# Patient Record
Sex: Male | Born: 1990 | Hispanic: Yes | Marital: Single | Smoking: Current every day smoker
Health system: Southern US, Community
[De-identification: ages and names within clinical notes are randomized; demographics above are authoritative.]

## PROBLEM LIST (undated history)

## (undated) DIAGNOSIS — J189 Pneumonia, unspecified organism: Secondary | ICD-10-CM

## (undated) HISTORY — PX: OTHER SURGICAL HISTORY: SHX169

## (undated) HISTORY — DX: Pneumonia, unspecified organism: J18.9

---
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
## 2015-04-12 ENCOUNTER — Emergency Department (HOSPITAL_BASED_OUTPATIENT_CLINIC_OR_DEPARTMENT_OTHER): Payer: Self-pay

## 2015-04-12 ENCOUNTER — Encounter (HOSPITAL_BASED_OUTPATIENT_CLINIC_OR_DEPARTMENT_OTHER): Payer: Self-pay | Admitting: *Deleted

## 2015-04-12 DIAGNOSIS — B349 Viral infection, unspecified: Secondary | ICD-10-CM | POA: Insufficient documentation

## 2015-04-12 DIAGNOSIS — H9209 Otalgia, unspecified ear: Secondary | ICD-10-CM | POA: Insufficient documentation

## 2015-04-12 DIAGNOSIS — Z720 Tobacco use: Secondary | ICD-10-CM | POA: Insufficient documentation

## 2015-04-12 NOTE — ED Notes (Signed)
Pt here with cough and cold and fever.  Pt states that he has pain with coughing in his chest.

## 2015-04-13 ENCOUNTER — Encounter (HOSPITAL_BASED_OUTPATIENT_CLINIC_OR_DEPARTMENT_OTHER): Payer: Self-pay | Admitting: Emergency Medicine

## 2015-04-13 ENCOUNTER — Emergency Department (HOSPITAL_BASED_OUTPATIENT_CLINIC_OR_DEPARTMENT_OTHER)
Admission: EM | Admit: 2015-04-13 | Discharge: 2015-04-13 | Disposition: A | Discharge status: Home / Self Care | Payer: Self-pay | Attending: Emergency Medicine | Admitting: Emergency Medicine

## 2015-04-13 DIAGNOSIS — B349 Viral infection, unspecified: Secondary | ICD-10-CM

## 2015-04-13 MED ORDER — FLUTICASONE PROPIONATE 50 MCG/ACT NA SUSP: 2.0000 | Freq: Every day | NASAL | Status: DC

## 2015-04-13 MED ORDER — GUAIFENESIN 200 MG PO TABS: 200.0000 mg | ORAL_TABLET | ORAL | Status: AC | PRN

## 2015-04-13 MED ORDER — LORATADINE 10 MG PO TABS
10.0000 mg | ORAL_TABLET | Freq: Once | ORAL | Status: AC
  Administered 2015-04-13: 10 mg via ORAL
  Filled 2015-04-13: qty 1

## 2015-04-13 MED ORDER — CETIRIZINE-PSEUDOEPHEDRINE ER 5-120 MG PO TB12: 1.0000 | ORAL_TABLET | Freq: Two times a day (BID) | ORAL | Status: DC

## 2015-04-13 MED ORDER — KETOROLAC TROMETHAMINE 60 MG/2ML IM SOLN
60.0000 mg | Freq: Once | INTRAMUSCULAR | Status: AC
  Administered 2015-04-13: 60 mg via INTRAMUSCULAR
  Filled 2015-04-13: qty 2

## 2015-04-13 NOTE — ED Provider Notes (Signed)
CSN: [id number]     Arrival date & time 04/12/15  2142 History   First MD Initiated Contact with Patient 04/13/15 0057     Chief Complaint  Patient presents with  . URI     (Consider location/radiation/quality/duration/timing/severity/associated sxs/prior Treatment) Patient is a 24 y.o. male presenting with URI. The history is provided by the patient.  URI Presenting symptoms: congestion, cough, ear pain and rhinorrhea   Presenting symptoms: no fever   Severity:  Moderate Onset quality:  Gradual Duration:  2 days Timing:  Constant Progression:  Unchanged Chronicity:  New Relieved by:  Nothing Worsened by:  Nothing tried Ineffective treatments:  None tried Associated symptoms: myalgias   Associated symptoms: no neck pain, no swollen glands and no wheezing   Risk factors: no sick contacts     History reviewed. No pertinent past medical history. History reviewed. No pertinent past surgical history. History reviewed. No pertinent family history. History  Substance Use Topics  . Smoking status: Current Every Day Smoker -- 0.50 packs/day  . Smokeless tobacco: Not on file  . Alcohol Use: No    Review of Systems  Constitutional: Negative for fever.  HENT: Positive for congestion, ear pain and rhinorrhea.   Eyes: Negative for photophobia.  Respiratory: Positive for cough. Negative for wheezing.   Musculoskeletal: Positive for myalgias. Negative for neck pain.  All other systems reviewed and are negative.     Allergies  Review of patient's allergies indicates no known allergies.  Home Medications   Prior to Admission medications   Medication Sig Start Date End Date Taking? Authorizing Provider  cetirizine-pseudoephedrine (ZYRTEC-D) 5-120 MG per tablet Take 1 tablet by mouth 2 (two) times daily. 04/13/15   Dalvin Clipper, MD  fluticasone [facility](FLONASE) 50 MCG/ACT nasal spray Place 2 sprays into both nostrils daily. 04/13/15   Rinnah Peppel, MD  guaiFENesin 200 MG tablet Take 1  tablet (200 mg total) by mouth every 4 (four) hours as needed for cough or to loosen phlegm. 04/13/15   Aloysuis Ribaudo, MD   BP 122/81 mmHg  Pulse 97  Temp(Src) 99.1 F (37.3 C) (Oral)  Resp 18  Ht 5\' 6"  (1.676 m)  Wt 180 lb (81.647 kg)  BMI 29.07 kg/m2  SpO2 96% Physical Exam  Constitutional: He is oriented to person, place, and time. He appears well-developed and well-nourished. No distress.  HENT:  Head: Normocephalic and atraumatic.  Mouth/Throat: Oropharynx is clear and moist. No oropharyngeal exudate.  Eyes: Conjunctivae are normal. Pupils are equal, round, and reactive to light.  Neck: Normal range of motion. Neck supple.  Cardiovascular: Normal rate, regular rhythm and intact distal pulses.   Pulmonary/Chest: Effort normal and breath sounds normal. No respiratory distress. He has no wheezes. He has no rales. He exhibits no tenderness.  Abdominal: Soft. Bowel sounds are normal. There is no tenderness. There is no rebound and no guarding.  Musculoskeletal: Normal range of motion.  Lymphadenopathy:    He has no cervical adenopathy.  Neurological: He is alert and oriented to person, place, and time.  Skin: Skin is warm and dry.  Psychiatric: He has a normal mood and affect.    ED Course  Procedures (including critical care time) Labs Review Labs Reviewed - No data to display  Imaging Review Dg Chest 2 View  04/12/2015   CLINICAL DATA:  Acute onset of cough and fever. Chest pain when coughing. Initial encounter.  EXAM: CHEST  2 VIEW  COMPARISON:  None.  FINDINGS: The lungs are well-aerated.  Peribronchial thickening is noted. There is no evidence of focal opacification, pleural effusion or pneumothorax.  The heart is normal in size; the mediastinal contour is within normal limits. No acute osseous abnormalities are seen.  IMPRESSION: Peribronchial thickening noted; lungs otherwise clear.   Electronically Signed   By: Roanna Raider M.D.   On: 04/12/2015 23:24     EKG  Interpretation None      MDM   Final diagnoses:  Viral syndrome    Will treat symptomatically.  Strict return precautions given.      Cy Blamer, MD 04/13/15 [phone]

## 2015-04-19 DIAGNOSIS — J189 Pneumonia, unspecified organism: Secondary | ICD-10-CM

## 2015-04-19 HISTORY — DX: Pneumonia, unspecified organism: J18.9

## 2015-04-22 ENCOUNTER — Emergency Department (HOSPITAL_BASED_OUTPATIENT_CLINIC_OR_DEPARTMENT_OTHER)
Admission: EM | Admit: 2015-04-22 | Discharge: 2015-04-22 | Disposition: A | Discharge status: Home / Self Care | Payer: Self-pay | Attending: Emergency Medicine | Admitting: Emergency Medicine

## 2015-04-22 ENCOUNTER — Emergency Department (HOSPITAL_BASED_OUTPATIENT_CLINIC_OR_DEPARTMENT_OTHER): Payer: Self-pay

## 2015-04-22 DIAGNOSIS — R51 Headache: Secondary | ICD-10-CM | POA: Insufficient documentation

## 2015-04-22 DIAGNOSIS — Z720 Tobacco use: Secondary | ICD-10-CM | POA: Insufficient documentation

## 2015-04-22 DIAGNOSIS — R059 Cough, unspecified: Secondary | ICD-10-CM

## 2015-04-22 DIAGNOSIS — R05 Cough: Secondary | ICD-10-CM | POA: Insufficient documentation

## 2015-04-22 DIAGNOSIS — M791 Myalgia: Secondary | ICD-10-CM | POA: Insufficient documentation

## 2015-04-22 DIAGNOSIS — M549 Dorsalgia, unspecified: Secondary | ICD-10-CM | POA: Insufficient documentation

## 2015-04-22 DIAGNOSIS — R509 Fever, unspecified: Secondary | ICD-10-CM | POA: Insufficient documentation

## 2015-04-22 DIAGNOSIS — Z79899 Other long term (current) drug therapy: Secondary | ICD-10-CM | POA: Insufficient documentation

## 2015-04-22 MED ORDER — IBUPROFEN 800 MG PO TABS
800.0000 mg | ORAL_TABLET | Freq: Once | ORAL | Status: AC
  Administered 2015-04-22: 800 mg via ORAL
  Filled 2015-04-22: qty 1

## 2015-04-22 MED ORDER — AZITHROMYCIN 250 MG PO TABS: 250.0000 mg | ORAL_TABLET | Freq: Every day | ORAL | Status: DC

## 2015-04-22 MED ORDER — ALBUTEROL SULFATE HFA 108 (90 BASE) MCG/ACT IN AERS
2.0000 | INHALATION_SPRAY | RESPIRATORY_TRACT | Status: DC | PRN
  Administered 2015-04-22: 2 via RESPIRATORY_TRACT
  Filled 2015-04-22: qty 6.7

## 2015-04-22 MED ORDER — ACETAMINOPHEN 500 MG PO TABS
ORAL_TABLET | ORAL | Status: DC
Start: 2015-04-22 — End: 2015-04-23
  Filled 2015-04-22: qty 2

## 2015-04-22 MED ORDER — ACETAMINOPHEN 500 MG PO TABS
1000.0000 mg | ORAL_TABLET | Freq: Once | ORAL | Status: AC
  Administered 2015-04-22: 1000 mg via ORAL

## 2015-04-22 MED ORDER — ACETAMINOPHEN 325 MG PO TABS: 650.0000 mg | ORAL_TABLET | Freq: Once | ORAL | Status: DC

## 2015-04-22 NOTE — ED Provider Notes (Signed)
CSN: [id number]     Arrival date & time 04/22/15  1745 History  This chart was scribed for Jerelyn ScottMartha Linker, MD by Bronson CurbJacqueline Melvin, ED Scribe. This patient was seen in room MH10/MH10 and the patient's care was started at 7:11 PM.   Chief Complaint  Patient presents with  . URI    Patient is a 24 y.o. male presenting with URI. The history is provided by the patient. No language interpreter was used.  URI Presenting symptoms: cough and fever   Presenting symptoms: no congestion and no sore throat   Severity:  Moderate Duration:  2 weeks Timing:  Constant Progression:  Unchanged Chronicity:  New Relieved by:  Nothing Ineffective treatments:  Prescription medications Associated symptoms: headaches and myalgias      HPI Comments: Jon Hill is a 24 y.o. male who presents to the Emergency Department complaining of possible URI for the past 1-2 weeks. There is associated fever (triage temp 102.4 F), headache, and persistent intermittent cough. Patient was seen here 1 week ago and was given nasal spray and another unknown medication with only mild relief. He reports his symptoms still persist and states he woke up today with "pain in the lungs" which prompted him to return to the ED. He also reports pain in his lungs that is worse on the right and also mentions associated moderate back pain that is worse with movement and deep breathing. Patient notes sick contacts with his father and sister, however, he reports their symptoms resolved after taking Tylenol. He denies sore throat, congestion, sinus pressure, dysuria, tick bites, or recent travel.   No past medical history on file. No past surgical history on file. No family history on file. History  Substance Use Topics  . Smoking status: Current Every Day Smoker -- 0.50 packs/day  . Smokeless tobacco: Not on file  . Alcohol Use: No    Review of Systems  Constitutional: Positive for fever.  HENT: Negative for congestion, sinus pressure and  sore throat.   Respiratory: Positive for cough.   Genitourinary: Negative for dysuria.  Musculoskeletal: Positive for myalgias and back pain.  Neurological: Positive for headaches.  All other systems reviewed and are negative.     Allergies  Review of patient's allergies indicates no known allergies.  Home Medications   Prior to Admission medications   Medication Sig Start Date End Date Taking? Authorizing Provider  azithromycin (ZITHROMAX) 250 MG tablet Take 1 tablet (250 mg total) by mouth daily. Take first 2 tablets together, then 1 every day until finished. 04/22/15   Jerelyn ScottMartha Linker, MD  cetirizine-pseudoephedrine (ZYRTEC-D) 5-120 MG per tablet Take 1 tablet by mouth 2 (two) times daily. 04/13/15   April Palumbo, MD  fluticasone [facility](FLONASE) 50 MCG/ACT nasal spray Place 2 sprays into both nostrils daily. 04/13/15   April Palumbo, MD  guaiFENesin 200 MG tablet Take 1 tablet (200 mg total) by mouth every 4 (four) hours as needed for cough or to loosen phlegm. 04/13/15   April Palumbo, MD   Triage Vitals: BP 123/76 mmHg  Pulse 116  Temp(Src) 102.4 F (39.1 C) (Oral)  Resp 18  Wt 180 lb (81.647 kg)  SpO2 100% Vitals reviewed Physical Exam  Physical Examination: General appearance - alert, well appearing, and in no distress Mental status - alert, oriented to person, place, and time Eyes - no conjunctival injection, no scleral icterus Mouth - mucous membranes moist, pharynx normal without lesions Chest - clear to auscultation, no wheezes, rales or rhonchi, symmetric air  entry, normal respiratory effort Heart - normal rate, regular rhythm, normal S1, S2, no murmurs, rubs, clicks or gallops Abdomen - soft, nontender, nondistended, no masses or organomegaly Extremities - peripheral pulses normal, no pedal edema, no clubbing or cyanosis Skin - normal coloration and turgor, no rashes  ED Course  Procedures (including critical care time)  DIAGNOSTIC STUDIES: Oxygen Saturation is 100% on  room air, normal by my interpretation.    COORDINATION OF CARE: At 1916 Discussed treatment plan with patient which includes albuterol inhaler and ibuprofen. Patient agrees.   Labs Review Labs Reviewed - No data to display  Imaging Review Dg Chest 2 View  04/22/2015   CLINICAL DATA:  Cough and fever for 2 weeks.  EXAM: CHEST  2 VIEW  COMPARISON:  04/12/2015  FINDINGS: The heart size and mediastinal contours are within normal limits. Chronic central peribronchial thickening again noted. No evidence of pulmonary infiltrate or pleural effusion. No evidence of pulmonary hyperinflation. The visualized skeletal structures are unremarkable.  IMPRESSION: Chronic central peribronchial thickening.  No active lung disease.   Electronically Signed   By: Myles RosenthalJohn  Stahl M.D.   On: 04/22/2015 19:02     EKG Interpretation None      MDM   Final diagnoses:  Febrile illness  Cough    Pt presenting with c/o fever/chills, cough and chest pain with coughing and deep breathing.  Pt states symptosm have been ongoing for the past 2 weeks.  He states the symptomatic treatment has not been helping with symptoms.  CXR was reassuring tonight.   Xray images reviewed and interpreted by me as well.  Pt was given albuterol inhaler to help with coughing.  Also given zithromax for possible clinical pneumonia even though it is not seen on the xray.  Pt's vitals improving after fever down, he is drinking liquids well.  Discharged with strict return precautions.  Pt agreeable with plan.  I personally performed the services described in this documentation, which was scribed in my presence. The recorded information has been reviewed and is accurate.    Jerelyn ScottMartha Linker, MD 04/24/15 [phone]

## 2015-04-22 NOTE — Discharge Instructions (Signed)
Return to the ED with any concerns including difficulty breathing, vomiting and not able to keep down liquids, fainting, decreased level of alertness/lethargy, or any other alarming symptoms  You should use the albuterol inhaler 2 puffs every 4 hours for cough

## 2015-04-22 NOTE — ED Notes (Signed)
Pt c/o URI symptoms  x 2 weeks Seen here last week for same

## 2015-04-23 ENCOUNTER — Emergency Department (HOSPITAL_COMMUNITY)
Admission: EM | Admit: 2015-04-23 | Discharge: 2015-04-23 | Disposition: A | Discharge status: Home / Self Care | Payer: Self-pay | Attending: Emergency Medicine | Admitting: Emergency Medicine

## 2015-04-23 ENCOUNTER — Encounter (HOSPITAL_COMMUNITY): Payer: Self-pay | Admitting: Emergency Medicine

## 2015-04-23 DIAGNOSIS — Z7951 Long term (current) use of inhaled steroids: Secondary | ICD-10-CM | POA: Insufficient documentation

## 2015-04-23 DIAGNOSIS — Z79899 Other long term (current) drug therapy: Secondary | ICD-10-CM | POA: Insufficient documentation

## 2015-04-23 DIAGNOSIS — Z720 Tobacco use: Secondary | ICD-10-CM | POA: Insufficient documentation

## 2015-04-23 DIAGNOSIS — R000 Tachycardia, unspecified: Secondary | ICD-10-CM | POA: Insufficient documentation

## 2015-04-23 DIAGNOSIS — J40 Bronchitis, not specified as acute or chronic: Secondary | ICD-10-CM | POA: Insufficient documentation

## 2015-04-23 DIAGNOSIS — Z792 Long term (current) use of antibiotics: Secondary | ICD-10-CM | POA: Insufficient documentation

## 2015-04-23 MED ORDER — HYDROCODONE-ACETAMINOPHEN 5-325 MG PO TABS
2.0000 | ORAL_TABLET | Freq: Once | ORAL | Status: AC
  Administered 2015-04-23: 2 via ORAL
  Filled 2015-04-23: qty 2

## 2015-04-23 MED ORDER — BENZONATATE 100 MG PO CAPS: 200.0000 mg | ORAL_CAPSULE | Freq: Two times a day (BID) | ORAL | Status: DC | PRN

## 2015-04-23 MED ORDER — ALBUTEROL SULFATE (2.5 MG/3ML) 0.083% IN NEBU
5.0000 mg | INHALATION_SOLUTION | Freq: Once | RESPIRATORY_TRACT | Status: AC
  Administered 2015-04-23: 5 mg via RESPIRATORY_TRACT
  Filled 2015-04-23: qty 6

## 2015-04-23 MED ORDER — IBUPROFEN 800 MG PO TABS
800.0000 mg | ORAL_TABLET | Freq: Once | ORAL | Status: AC
  Administered 2015-04-23: 800 mg via ORAL
  Filled 2015-04-23: qty 1

## 2015-04-23 MED ORDER — NAPROXEN 500 MG PO TABS: 500.0000 mg | ORAL_TABLET | Freq: Two times a day (BID) | ORAL | Status: AC

## 2015-04-23 MED ORDER — IPRATROPIUM BROMIDE 0.02 % IN SOLN
0.5000 mg | RESPIRATORY_TRACT | Status: AC
  Administered 2015-04-23: 0.5 mg via RESPIRATORY_TRACT
  Filled 2015-04-23: qty 2.5

## 2015-04-23 MED ORDER — SODIUM CHLORIDE 0.9 % IV BOLUS (SEPSIS): 1000.0000 mL | INTRAVENOUS | Status: DC

## 2015-04-23 MED ORDER — ACETAMINOPHEN 325 MG PO TABS: 650.0000 mg | ORAL_TABLET | Freq: Once | ORAL | Status: DC

## 2015-04-23 MED ORDER — HYDROCODONE-ACETAMINOPHEN 5-325 MG PO TABS: 1.0000 | ORAL_TABLET | ORAL | Status: DC | PRN

## 2015-04-23 NOTE — ED Notes (Addendum)
Pt was seen at [facility] last [facility]. Given prescription for Azithromycin but has not had the chance to fill yet. Was given an inhaler on 4/25 and says he has been using this but now "it hurts to breathe." Also given Zyrtec and Guaifenesin on 4/25. SpO2 currently 98%. RR even/unlabored. No audible wheezing. Points to left mid back and right upper chest as painful during inspiration. No other c/c.

## 2015-04-23 NOTE — ED Notes (Addendum)
Per EMS- reports cold symptoms x2 weeks. Prescribed antibiotics recently and finished those. Had CXR completed last visit with no abnormal findings. Has had increase in mucous output and reports hemoptysis. VS: BP 116/74 HR 114 SpO2 96% Temp 98.4 tympanic.   Addendum-patient has not started antibiotic therapy as of today.

## 2015-04-23 NOTE — Discharge Instructions (Signed)
Please follow the directions provided.  Use the referral or the resource guide to establish care with a doctor to ensure you are getting better.  Be sure to fill the prescription for the antibiotic and take as directed.  Use your albuterol inhaler 2 puffs every 4 hours for cough or wheezing.  Use the tessalon perles as directed for coughing.  Take the Naproxen twice a day for pain.  You may use the vicodin for pain not relieved by the naproxen.  Don't hesitate to return for any new, worsening  or concerning symptoms.      SEEK IMMEDIATE MEDICAL CARE IF:  You have a fever.  You develop severe or persistent headache, ear pain, sinus pain, or chest pain.  You develop wheezing, a prolonged cough, cough up blood, or have a change in your usual mucus (if you have chronic lung disease).  You develop sore muscles or a stiff neck.   Emergency Department Resource Guide 1) Find a Doctor and Pay Out of Pocket Although you won't have to find out who is covered by your insurance plan, it is a good idea to ask around and get recommendations. You will then need to call the office and see if the doctor you have chosen will accept you as a new patient and what types of options they offer for patients who are self-pay. Some doctors offer discounts or will set up payment plans for their patients who do not have insurance, but you will need to ask so you aren't surprised when you get to your appointment.  2) Contact Your Local Health Department Not all health departments have doctors that can see patients for sick visits, but many do, so it is worth a call to see if yours does. If you don't know where your local health department is, you can check in your phone book. The CDC also has a tool to help you locate your state's health department, and many state websites also have listings of all of their local health departments.  3) Find a Walk-in Clinic If your illness is not likely to be very severe or complicated, you  may want to try a walk in clinic. These are popping up all over the country in pharmacies, drugstores, and shopping centers. They're usually staffed by nurse practitioners or physician assistants that have been trained to treat common illnesses and complaints. They're usually fairly quick and inexpensive. However, if you have serious medical issues or chronic medical problems, these are probably not your best option.  No Primary Care Doctor: - Call Health Connect at  [phone] - they can help you locate a primary care doctor that  accepts your insurance, provides certain services, etc. - Physician Referral Service- [phone]  Chronic Pain Problems: Organization         Address  Phone   Notes  [facility]  [phone] Patients need to be referred by their primary care doctor.   Medication Assistance: Organization         Address  Phone   Notes  [facility] Medication [facility] [address] [place] [place], [place] [address] [phone] --Must be a resident of [facility] -- Must have NO insurance coverage whatsoever (no Medicaid/ Medicare, etc.) -- The pt. MUST have a primary care doctor that directs their care regularly and follows them in the community   [facility]  [phone]   [employer]  [phone]    Agencies that provide inexpensive  medical care: Organization         Address  Phone   Notes  [facility]  [phone]   [facility]    [phone]   [facility] [address] [place], [place] [address] [phone]   [facility] [address] [place], [place] [phone]   Planned Parenthood    [phone]   [facility]    [phone]   [facility]  [address], [place] Phone:  [phone], Fax:  [phone] Hours of Operation:  9 am - 6 pm, M-F.  Also accepts Medicaid/Medicare and  self-pay.  [facility]  [address], [place] Phone: [phone], Fax: [phone]. Hours of Operation:  8:30 am - 5:30 pm, M-F.  Also accepts Medicaid and self-pay.  [facility] [place] [address], [place] Phone: [phone]   [facility] [address] Natasha Bence [place], [place] [phone], Ext. [phone] Mondays & Thursdays: 7-9 AM.  First 15 patients are seen on a first come, first serve basis.    Medicaid-accepting [facility] Providers:  Organization         Address  Phone   Notes  [facility] [address], [place] [phone] Also accepts self-pay patients.  [facility] [address] Laurell Josephs [place], [place]  [phone]   [facility] [address], [place] [phone]   [facility] [address], [place] [phone]   Renaye Rakers [address], [place]   [phone] Only accepts [place] patients after they have their name applied to their card.   Self-Pay (no insurance) in [facility]:  Organization         Address  Phone   Notes  Sickle Cell Patients, [facility] [address] [place], [place] [phone]   [facility] [address] [place], [place] [phone]   [facility]  [address], [place] [phone]   [facility]/Dr. Osei-Bonsu  [address], [place] or [address], [place] [phone] Phone number for both [place] and [place] locations is the same.  [facility] [address] [place] [phone]   [facility] [address] [place] or [address] [phone]   [facility] [address], [place] [phone], phone;  [phone], fax Sees patients 1st and 3rd Saturday of every month.  Must not qualify for public or private insurance (i.e. Medicaid, Medicare, [place] Health Choice, Veterans' Benefits)  Household income should be no more than 200% of the poverty level The clinic cannot treat you if you are pregnant or think you are pregnant  Sexually transmitted diseases are not treated at the clinic.    Dental Care: Organization         Address  Phone  Notes  [facility] Department of [facility] [address] [place], [place] [phone] Accepts children up to age 11 who are enrolled in [place] or [place] Health Choice; pregnant women with a Medicaid card; and children who have applied for Medicaid or [place] Health Choice, but  were declined, whose parents can pay a reduced fee at time of service.  [facility] [place]  [address], [place] [phone]) [phone] Accepts children up to age 23 who are enrolled in [place] or [place] Health Choice; pregnant women with a Medicaid card; and children who have applied for Medicaid or [place] Health Choice, but were declined, whose parents can pay a reduced fee at time of service.  [facility]  [address], [place] [phone] Patients are seen by appointment only. Walk-ins are not accepted. [facility] will see patients 23 years of age and older. Monday - Tuesday (8am-5pm) Most Wednesdays (8:30-5pm) $30 per visit, cash only  [facility]  [address], [facility] [phone] Patients are seen by appointment only. Walk-ins are not accepted. [facility] will see patients 23 years of age and older. One Wednesday Evening (Monthly: Volunteer Based).  $30 per visit, cash only  [employer] [facility] [facility]  [phone]) [phone] for adults; Children under age 694, call Graduate Pediatric Dentistry at [phone]. Children aged 844-14, please call  [phone]) [phone] to request a pediatric application.  Dental services are provided in all areas of dental care including fillings, crowns and bridges, complete and partial dentures, implants, gum treatment, root canals, and extractions. Preventive care is also provided. Treatment is provided to both adults and children. Patients are selected via a lottery and there is often a waiting list.   [facility] [address], [place]  [phone]) [phone] [email or web address]   [facility] [address], [place], [place] [phone], Ext. [phone] Second and Fourth Thursday of each month, opens at 6:30 AM; Clinic ends at 9 AM.  Patients are seen on a first-come first-served basis, and a limited number are seen during each clinic.   [facility]  [address], [place], [place] [phone]   Eligibility Requirements You must have lived in [place], [place], or [place] counties for at least the last three months.   You cannot be eligible for state or federal sponsored National Cityhealthcare insurance, including CIGNAVeterans Administration, [place]icaid, or [employer]icare.   You generally cannot be eligible for healthcare insurance through your employer.    How to apply: Eligibility screenings are held every Tuesday and Wednesday afternoon from 1:00 pm until 4:00 pm. You do not need an appointment for the interview!  [facility] [address], [place], [place] [phone]   [facility]  [phone]   [facility]  [phone]   [facility]  [phone]    Behavioral Health Resources in the Community: Intensive Outpatient Programs Organization         Address  Phone  Notes  [facility] [address], [place], [place] [phone]   [facility] [address], [place], [place] [phone]   [facility] [address], [place], [place]   [phone]   [facility] [address],  [place], [place] [phone] or [phone]   Substance Abuse Resources Organization         Address  Phone  Notes  [facility]  [phone]   [facility]  [phone]   [facility]  [phone]   [facility]  [phone]   Residential & Outpatient Substance Abuse Program  [phone]   Psychological Services  Organization         Address  Phone  Notes  [facility]  [phone]- [phone]   [facility]  [phone]- [phone]   [facility] [address], [place] [phone] or [phone]    Mobile Crisis Teams Organization         Address  Phone  Notes  [facility], Mobile Crisis Care Unit  [phone]   [facility]  [address] [place], [place] [phone]   Doristine LocksSharon DeEsch [address] [place] [phone]    Self-Help/Support Groups Organization         Address  Phone             Notes  [facility] [place] - variety of support groups  336- [phone] Call for more information  Narcotics Anonymous (NA), [facility] [address], [place]  2 meetings at this location   Statisticianesidential Treatment Programs Organization         Address  Phone  Notes  [facility] [address],    [place]  [phone]   [facility]  [address], [place] [phone], [place], [place] [phone]   [facility] [address], [place] [phone] Admissions: 8am-3pm M-F  [facility] [address]    [place], [place] [phone]   [facility] [address], [place], [place] [phone]   [facility] [address]  [place], [place] [phone]   [facility] - Intensive Outpatient [address], [place], [place] [phone]   [facility] [address]  [place], [place] [phone] or [phone]   [facility] ([facility]) [address] [place], [place] [phone] Accepts Medicaid  [facility] [address]  [place] [phone] Substance Abuse/Addiction Treatment   [facility] Resources Organization         Address  Phone  Notes  [facility]  [phone]   Angie FavaJulie Brannon, PhD [address], [address] [place], [place]   [phone](336) [phone] or [phone]) [phone]   [facility]   [address] [place], [place] [phone]) [phone]   [facility] [address], [place], [place] [phone] Insurance/Medicaid/sponsorship through [facility]  [facility] [address]                                    [place], [place] [phone]) [phone] Therapy/tele-psych/case  [facility] [address]   [place], [place] [phone]) [phone]    Dr. Lolly MustacheArfeen  [phone]) [phone]   [facility]  [employer] [facility] 1) [address],  2) [address], [place] 3)  [address], [place] [phone]  [phone]([phone]   [facility] Child Abuse Hotline [phone](336) [phone] or [phone](336) [phone] (After Hours)

## 2015-04-23 NOTE — ED Notes (Signed)
Bed: ZO10WA11 Expected date:  Expected time:  Means of arrival:  Comments: EMS 24 yo male with URI symptoms and "coughing up blood"

## 2015-04-23 NOTE — ED Notes (Signed)
PA at bedside.

## 2015-04-23 NOTE — ED Provider Notes (Signed)
CSN: [id number]     Arrival date & time 04/23/15  0458 History   First MD Initiated Contact with Patient 04/23/15 0515     Chief Complaint  Patient presents with  . Hemoptysis  . URI   (Consider location/radiation/quality/duration/timing/severity/associated sxs/prior Treatment) HPI  Jon Hill is a 24 yo male presenting with continued coughing and pain with inspiration.  He was seen last night for the same symptoms but returned because of continued pain in his chest with coughing and deep breaths.  He reports recent fevers and chills also.  He states he has had some sputum production with coughing and describes it as clear with some bloody streaks. He rates his pain currently as 10/10. He denies any headaches, nausea, vomiting, leg swelling, recent travel or trauma.  History reviewed. No pertinent past medical history. History reviewed. No pertinent past surgical history. History reviewed. No pertinent family history. History  Substance Use Topics  . Smoking status: Current Every Day Smoker -- 0.50 packs/day  . Smokeless tobacco: Not on file  . Alcohol Use: No    Review of Systems  Constitutional: Positive for fever and chills.  HENT: Negative for sore throat.   Eyes: Negative for visual disturbance.  Respiratory: Positive for cough, chest tightness and shortness of breath.   Cardiovascular: Negative for chest pain and leg swelling.  Gastrointestinal: Negative for nausea, vomiting and diarrhea.  Genitourinary: Negative for dysuria.  Musculoskeletal: Positive for myalgias.  Skin: Negative for rash.  Neurological: Negative for weakness, numbness and headaches.      Allergies  Review of patient's allergies indicates no known allergies.  Home Medications   Prior to Admission medications   Medication Sig Start Date End Date Taking? Authorizing Provider  azithromycin (ZITHROMAX) 250 MG tablet Take 1 tablet (250 mg total) by mouth daily. Take first 2 tablets together, then 1 every  day until finished. 04/22/15   Jerelyn ScottMartha Linker, MD  cetirizine-pseudoephedrine (ZYRTEC-D) 5-120 MG per tablet Take 1 tablet by mouth 2 (two) times daily. 04/13/15   April Palumbo, MD  fluticasone [facility](FLONASE) 50 MCG/ACT nasal spray Place 2 sprays into both nostrils daily. 04/13/15   April Palumbo, MD  guaiFENesin 200 MG tablet Take 1 tablet (200 mg total) by mouth every 4 (four) hours as needed for cough or to loosen phlegm. 04/13/15   April Palumbo, MD   BP 127/69 mmHg  Pulse 113  Temp(Src) 101.8 F (38.8 C) (Oral)  SpO2 98% Physical Exam  Constitutional: He appears well-developed and well-nourished. No distress.  HENT:  Head: Normocephalic and atraumatic.  Mouth/Throat: Oropharynx is clear and moist. No oropharyngeal exudate.  Eyes: Conjunctivae are normal.  Neck: Neck supple.  Cardiovascular: Regular rhythm and intact distal pulses.  Tachycardia present.   Pulmonary/Chest: Effort normal and breath sounds normal. No respiratory distress. He has no wheezes. He has no rales. He exhibits no tenderness.  Abdominal: Soft. There is no tenderness.  Musculoskeletal: He exhibits no tenderness.  Lymphadenopathy:    He has no cervical adenopathy.  Neurological: He is alert.  Skin: Skin is warm and dry. No rash noted. He is not diaphoretic.  Skin feels hot  Psychiatric: He has a normal mood and affect.  Nursing note and vitals reviewed.   ED Course  Procedures (including critical care time) Labs Review Labs Reviewed - No data to display  Imaging Review Dg Chest 2 View  04/22/2015   CLINICAL DATA:  Cough and fever for 2 weeks.  EXAM: CHEST  2 VIEW  COMPARISON:  04/12/2015  FINDINGS: The heart size and mediastinal contours are within normal limits. Chronic central peribronchial thickening again noted. No evidence of pulmonary infiltrate or pleural effusion. No evidence of pulmonary hyperinflation. The visualized skeletal structures are unremarkable.  IMPRESSION: Chronic central peribronchial  thickening.  No active lung disease.   Electronically Signed   By: Myles RosenthalJohn  Hill M.D.   On: 04/22/2015 19:02     EKG Interpretation None      MDM   Final diagnoses:  None   24 yo with continued cough, fever and generalized muscle aches. He was evaluated for same last night but symptoms still bothersome. His CXR at last eval was negative for pneumonia. Pt is febrile on exam, lungs are clear. Treated with neb treatment, ibuprofen and vicodin for symptom management.   6:00 AM: At end of shift, hand-off report given to [facility] Cartner, PA-C. Plan includes re-eval after treatment and most likely may be discharged home after symptoms improve.   Filed Vitals:   04/23/15 0502 04/23/15 0528 04/23/15 0621 04/23/15 0636  BP: 127/69  114/60   Pulse: 113  104   Temp: 98.7 F (37.1 C) 101.8 F (38.8 C)  98.5 F (36.9 C)  TempSrc: Oral Oral  Oral  Resp:   17   SpO2: 98%  100%    Meds given in ED:  Medications  albuterol (PROVENTIL) (2.5 MG/3ML) 0.083% nebulizer solution 5 mg (5 mg Nebulization Given 04/23/15 0553)  ipratropium (ATROVENT) nebulizer solution 0.5 mg (0.5 mg Nebulization Given 04/23/15 0553)  ibuprofen (ADVIL,MOTRIN) tablet 800 mg (800 mg Oral Given 04/23/15 0553)  HYDROcodone-acetaminophen (NORCO/VICODIN) 5-325 MG per tablet 2 tablet (2 tablets Oral Given 04/23/15 0553)    New Prescriptions   No medications on file       Harle BattiestElizabeth Anysha Frappier, NP 04/23/15 1710  Marisa Severinlga Otter, MD 04/24/15 [phone]

## 2015-04-25 ENCOUNTER — Emergency Department (HOSPITAL_COMMUNITY): Payer: Self-pay

## 2015-04-25 ENCOUNTER — Inpatient Hospital Stay (HOSPITAL_COMMUNITY)
Admission: EM | Admit: 2015-04-25 | Discharge: 2015-04-29 | DRG: 871 | Disposition: A | Discharge status: Home / Self Care | Payer: Self-pay | Attending: Internal Medicine | Admitting: Internal Medicine

## 2015-04-25 ENCOUNTER — Encounter (HOSPITAL_COMMUNITY): Payer: Self-pay | Admitting: Emergency Medicine

## 2015-04-25 DIAGNOSIS — E872 Acidosis: Secondary | ICD-10-CM | POA: Diagnosis present

## 2015-04-25 DIAGNOSIS — D6489 Other specified anemias: Secondary | ICD-10-CM | POA: Diagnosis present

## 2015-04-25 DIAGNOSIS — F17200 Nicotine dependence, unspecified, uncomplicated: Secondary | ICD-10-CM

## 2015-04-25 DIAGNOSIS — E876 Hypokalemia: Secondary | ICD-10-CM | POA: Diagnosis not present

## 2015-04-25 DIAGNOSIS — A419 Sepsis, unspecified organism: Principal | ICD-10-CM

## 2015-04-25 DIAGNOSIS — J9601 Acute respiratory failure with hypoxia: Secondary | ICD-10-CM | POA: Diagnosis present

## 2015-04-25 DIAGNOSIS — D649 Anemia, unspecified: Secondary | ICD-10-CM | POA: Diagnosis present

## 2015-04-25 DIAGNOSIS — R7881 Bacteremia: Secondary | ICD-10-CM | POA: Diagnosis present

## 2015-04-25 DIAGNOSIS — J441 Chronic obstructive pulmonary disease with (acute) exacerbation: Secondary | ICD-10-CM | POA: Diagnosis present

## 2015-04-25 DIAGNOSIS — R0602 Shortness of breath: Secondary | ICD-10-CM

## 2015-04-25 DIAGNOSIS — F17210 Nicotine dependence, cigarettes, uncomplicated: Secondary | ICD-10-CM | POA: Diagnosis present

## 2015-04-25 DIAGNOSIS — J13 Pneumonia due to Streptococcus pneumoniae: Secondary | ICD-10-CM | POA: Diagnosis present

## 2015-04-25 DIAGNOSIS — J189 Pneumonia, unspecified organism: Secondary | ICD-10-CM

## 2015-04-25 LAB — I-STAT CG4 LACTIC ACID, ED
Lactic Acid, Venous: 4.4 mmol/L (ref 0.5–2.0)
Lactic Acid, Venous: 2.85 mmol/L (ref 0.5–2.0)

## 2015-04-25 LAB — COMPREHENSIVE METABOLIC PANEL
ALBUMIN: 2.8 g/dL — AB (ref 3.5–5.0)
ALT: 25 U/L (ref 17–63)
AST: 26 U/L (ref 15–41)
Alkaline Phosphatase: 112 U/L (ref 38–126)
Anion gap: 9 (ref 5–15)
BUN: 12 mg/dL (ref 6–20)
CHLORIDE: 106 mmol/L (ref 101–111)
CO2: 24 mmol/L (ref 22–32)
CREATININE: 0.88 mg/dL (ref 0.61–1.24)
Calcium: 8.3 mg/dL — ABNORMAL LOW (ref 8.9–10.3)
GFR calc Af Amer: >60 mL/min (ref >60)
GFR calc non Af Amer: >60 mL/min (ref >60)
Glucose, Bld: 94 mg/dL (ref 70–99)
Potassium: 4 mmol/L (ref 3.5–5.1)
SODIUM: 139 mmol/L (ref 135–145)
TOTAL PROTEIN: 6.4 g/dL — AB (ref 6.5–8.1)
Total Bilirubin: 0.7 mg/dL (ref 0.3–1.2)

## 2015-04-25 LAB — URINE MICROSCOPIC-ADD ON

## 2015-04-25 LAB — RAPID HIV SCREEN (HIV 1/2 AB+AG)
HIV 1/2 Antibodies: NONREACTIVE
HIV-1 P24 Antigen - HIV24: NONREACTIVE

## 2015-04-25 LAB — CBC WITH DIFFERENTIAL/PLATELET
Basophils Absolute: 0 10*3/uL (ref 0.0–0.1)
Basophils Relative: 0 % (ref 0–1)
Eosinophils Absolute: 0 10*3/uL (ref 0.0–0.7)
Eosinophils Relative: 0 % (ref 0–5)
HEMATOCRIT: 42.1 % (ref 39.0–52.0)
HEMOGLOBIN: 14.7 g/dL (ref 13.0–17.0)
LYMPHS ABS: 0.8 10*3/uL (ref 0.7–4.0)
Lymphocytes Relative: 4 % — ABNORMAL LOW (ref 12–46)
MCH: 29.7 pg (ref 26.0–34.0)
MCHC: 34.9 g/dL (ref 30.0–36.0)
MCV: 85.1 fL (ref 78.0–100.0)
Monocytes Absolute: 0.4 10*3/uL (ref 0.1–1.0)
Monocytes Relative: 2 % — ABNORMAL LOW (ref 3–12)
NEUTROS ABS: 19.7 10*3/uL — AB (ref 1.7–7.7)
Neutrophils Relative %: 94 % — ABNORMAL HIGH (ref 43–77)
Platelets: 253 10*3/uL (ref 150–400)
RBC: 4.95 MIL/uL (ref 4.22–5.81)
RDW: 13.2 % (ref 11.5–15.5)
WBC Morphology: INCREASED
WBC: 20.9 10*3/uL — ABNORMAL HIGH (ref 4.0–10.5)

## 2015-04-25 LAB — URINALYSIS, ROUTINE W REFLEX MICROSCOPIC
Glucose, UA: NEGATIVE mg/dL
Hgb urine dipstick: NEGATIVE
KETONES UR: NEGATIVE mg/dL
NITRITE: NEGATIVE
Protein, ur: 30 mg/dL — AB
Specific Gravity, Urine: 1.023 (ref 1.005–1.030)
UROBILINOGEN UA: 4 mg/dL — AB (ref 0.0–1.0)
pH: 6 (ref 5.0–8.0)

## 2015-04-25 LAB — INFLUENZA PANEL BY PCR (TYPE A & B)
H1N1FLUPCR: NOT DETECTED
Influenza A By PCR: NEGATIVE
Influenza B By PCR: NEGATIVE

## 2015-04-25 LAB — PROTIME-INR
INR: 1.21 (ref 0.00–1.49)
PROTHROMBIN TIME: 15.4 s — AB (ref 11.6–15.2)

## 2015-04-25 LAB — EXPECTORATED SPUTUM ASSESSMENT W REFEX TO RESP CULTURE

## 2015-04-25 LAB — APTT: APTT: 43 s — AB (ref 24–37)

## 2015-04-25 LAB — LACTIC ACID, PLASMA: LACTIC ACID, VENOUS: 2.6 mmol/L — AB (ref 0.5–2.0)

## 2015-04-25 LAB — D-DIMER, QUANTITATIVE: D-Dimer, Quant: 1.05 ug/mL-FEU — ABNORMAL HIGH (ref 0.00–0.48)

## 2015-04-25 LAB — EXPECTORATED SPUTUM ASSESSMENT W GRAM STAIN, RFLX TO RESP C

## 2015-04-25 LAB — PROCALCITONIN: Procalcitonin: 2.07 ng/mL

## 2015-04-25 MED ORDER — ENOXAPARIN SODIUM 40 MG/0.4ML ~~LOC~~ SOLN
40.0000 mg | SUBCUTANEOUS | Status: DC
  Administered 2015-04-25 – 2015-04-28 (×4): 40 mg via SUBCUTANEOUS
  Filled 2015-04-25 (×6): qty 0.4

## 2015-04-25 MED ORDER — SODIUM CHLORIDE 0.9 % IV SOLN
INTRAVENOUS | Status: DC
  Administered 2015-04-25 – 2015-04-26 (×2): via INTRAVENOUS

## 2015-04-25 MED ORDER — SODIUM CHLORIDE 0.9 % IJ SOLN
3.0000 mL | Freq: Two times a day (BID) | INTRAMUSCULAR | Status: DC
  Administered 2015-04-26 – 2015-04-28 (×5): 3 mL via INTRAVENOUS

## 2015-04-25 MED ORDER — SODIUM CHLORIDE 0.9 % IV BOLUS (SEPSIS)
1000.0000 mL | Freq: Once | INTRAVENOUS | Status: AC
  Administered 2015-04-25: 1000 mL via INTRAVENOUS

## 2015-04-25 MED ORDER — ONDANSETRON HCL 4 MG/2ML IJ SOLN
4.0000 mg | Freq: Four times a day (QID) | INTRAMUSCULAR | Status: DC | PRN
  Administered 2015-04-25: 4 mg via INTRAVENOUS
  Filled 2015-04-25: qty 2

## 2015-04-25 MED ORDER — ACETAMINOPHEN 325 MG PO TABS
650.0000 mg | ORAL_TABLET | Freq: Four times a day (QID) | ORAL | Status: DC | PRN
  Administered 2015-04-28: 650 mg via ORAL
  Filled 2015-04-25: qty 2

## 2015-04-25 MED ORDER — CEFTRIAXONE SODIUM 1 G IJ SOLR
1.0000 g | INTRAMUSCULAR | Status: DC
  Administered 2015-04-26 – 2015-04-29 (×4): 1 g via INTRAVENOUS
  Filled 2015-04-25 (×5): qty 10

## 2015-04-25 MED ORDER — AZITHROMYCIN 250 MG PO TABS
500.0000 mg | ORAL_TABLET | Freq: Once | ORAL | Status: AC
  Administered 2015-04-25: 500 mg via ORAL
  Filled 2015-04-25: qty 2

## 2015-04-25 MED ORDER — MORPHINE SULFATE 2 MG/ML IJ SOLN
1.0000 mg | INTRAMUSCULAR | Status: DC | PRN
Start: 2015-04-25 — End: 2015-04-27

## 2015-04-25 MED ORDER — ONDANSETRON HCL 4 MG PO TABS: 4.0000 mg | ORAL_TABLET | Freq: Four times a day (QID) | ORAL | Status: DC | PRN

## 2015-04-25 MED ORDER — ACETAMINOPHEN 650 MG RE SUPP: 650.0000 mg | Freq: Four times a day (QID) | RECTAL | Status: DC | PRN

## 2015-04-25 MED ORDER — DEXTROSE 5 % IV SOLN
1.0000 g | Freq: Once | INTRAVENOUS | Status: AC
  Administered 2015-04-25: 1 g via INTRAVENOUS
  Filled 2015-04-25: qty 10

## 2015-04-25 MED ORDER — AZITHROMYCIN 500 MG PO TABS
500.0000 mg | ORAL_TABLET | ORAL | Status: DC
  Administered 2015-04-26 – 2015-04-28 (×3): 500 mg via ORAL
  Filled 2015-04-25: qty 2
  Filled 2015-04-25 (×3): qty 1

## 2015-04-25 NOTE — ED Provider Notes (Signed)
CSN: [id number]     Arrival date & time 04/25/15  1358 History   First MD Initiated Contact with Patient 04/25/15 1430     Chief Complaint  Patient presents with  . Shortness of Breath     (Consider location/radiation/quality/duration/timing/severity/associated sxs/prior Treatment) HPI  The patient is a 24 year old male, he presents to the hospital today with his third visit in the last 4 days. He has reported approximately 1-2 weeks of cough with increased weakness. On the first visit he had a chest x-ray which was unremarkable, 2 days ago he had significant improvement with medications in the emergency department and was discharged home. He has had persistent fevers and increased cough over the last 24 hours, now he feels weak when he stands up, difficulty breathing, generalized weakness is worsened. He has been using the medications that was prescribed including a cough medication, and albuterol inhaler and some pain medication but this does not seem to make him feel better. His symptoms are worsening and they are now severe.  History reviewed. No pertinent past medical history. No past surgical history on file. No family history on file. History  Substance Use Topics  . Smoking status: Current Every Day Smoker -- 0.50 packs/day  . Smokeless tobacco: Not on file  . Alcohol Use: No    Review of Systems  All other systems reviewed and are negative.     Allergies  Review of patient's allergies indicates no known allergies.  Home Medications   Prior to Admission medications   Medication Sig Start Date End Date Taking? Authorizing Provider  albuterol (PROVENTIL HFA;VENTOLIN HFA) 108 (90 BASE) MCG/ACT inhaler Inhale 2 puffs into the lungs every 4 (four) hours as needed for wheezing or shortness of breath.   Yes Historical Provider, MD  benzonatate (TESSALON) 100 MG capsule Take 2 capsules (200 mg total) by mouth 2 (two) times daily as needed for cough. 04/23/15  Yes Harle BattiestElizabeth  Tysinger, NP  guaiFENesin 200 MG tablet Take 1 tablet (200 mg total) by mouth every 4 (four) hours as needed for cough or to loosen phlegm. 04/13/15  Yes April Palumbo, MD  HYDROcodone-acetaminophen (NORCO/VICODIN) 5-325 MG per tablet Take 1 tablet by mouth every 4 (four) hours as needed. 04/23/15  Yes Harle BattiestElizabeth Tysinger, NP  naproxen (NAPROSYN) 500 MG tablet Take 1 tablet (500 mg total) by mouth 2 (two) times daily. 04/23/15  Yes Harle BattiestElizabeth Tysinger, NP  azithromycin (ZITHROMAX) 250 MG tablet Take 1 tablet (250 mg total) by mouth daily. Take first 2 tablets together, then 1 every day until finished. Patient not taking: Reported on 04/25/2015 04/22/15   Jerelyn ScottMartha Linker, MD  cetirizine-pseudoephedrine (ZYRTEC-D) 5-120 MG per tablet Take 1 tablet by mouth 2 (two) times daily. Patient not taking: Reported on 04/25/2015 04/13/15   April Palumbo, MD   BP 93/57 mmHg  Pulse 139  Temp(Src) 97.6 F (36.4 C) (Oral)  Resp 20  SpO2 94% Physical Exam  Constitutional: He appears well-developed and well-nourished. He appears distressed.  HENT:  Head: Normocephalic and atraumatic.  Mouth/Throat: Oropharynx is clear and moist. No oropharyngeal exudate.  Eyes: Conjunctivae and EOM are normal. Pupils are equal, round, and reactive to light. Right eye exhibits no discharge. Left eye exhibits no discharge. No scleral icterus.  Neck: Normal range of motion. Neck supple. No JVD present. No thyromegaly present.  Cardiovascular: Regular rhythm, normal heart sounds and intact distal pulses.  Exam reveals no gallop and no friction rub.   No murmur heard. Tachycardic to 140 on  arrival, hypotensive to 93/57  Pulmonary/Chest: He is in respiratory distress. He has no wheezes. He has rales ( soft rales at the left base).  Tachypnea, increased work of breathing, speaks in 2-3 word sentences, oxygen of 94% on supple metal oxygen  Abdominal: Soft. Bowel sounds are normal. He exhibits no distension and no mass. There is no tenderness.   Musculoskeletal: Normal range of motion. He exhibits no edema or tenderness.  Lymphadenopathy:    He has no cervical adenopathy.  Neurological: He is alert. Coordination normal.  Skin: Skin is warm and dry. No rash noted. No erythema.  Psychiatric: He has a normal mood and affect. His behavior is normal.  Nursing note and vitals reviewed.   ED Course  Procedures (including critical care time) Labs Review Labs Reviewed  D-DIMER, QUANTITATIVE - Abnormal; Notable for the following:    D-Dimer, Quant 1.05 (*)    All other components within normal limits  CBC WITH DIFFERENTIAL/PLATELET - Abnormal; Notable for the following:    WBC 20.9 (*)    All other components within normal limits  COMPREHENSIVE METABOLIC PANEL - Abnormal; Notable for the following:    Calcium 8.3 (*)    Total Protein 6.4 (*)    Albumin 2.8 (*)    All other components within normal limits  I-STAT CG4 LACTIC ACID, ED - Abnormal; Notable for the following:    Lactic Acid, Venous 4.40 (*)    All other components within normal limits  CULTURE, BLOOD (ROUTINE X 2)  CULTURE, BLOOD (ROUTINE X 2)  URINE CULTURE  RAPID HIV SCREEN (HIV 1/2 AB+AG)  INFLUENZA PANEL BY PCR (TYPE A & B, H1N1)  URINALYSIS, ROUTINE W REFLEX MICROSCOPIC    Imaging Review Dg Chest Port 1 View  04/25/2015   CLINICAL DATA:  Shortness of breath, cough, and weakness for 2 weeks, was diagnosed with bronchitis ; today presents with tachycardia, systolic BP of 90's, smoker  EXAM: PORTABLE CHEST - 1 VIEW  COMPARISON:  Portable exam 1443 hr compared to 04/22/2015  FINDINGS: Normal heart size, mediastinal contours and pulmonary vascularity.  LEFT lower lobe consolidation consistent with pneumonia new since prior exam.  Linear subsegmental atelectasis lower LEFT chest at lingula.  Remaining lungs clear.  No pleural effusion or pneumothorax.  Osseous structures unremarkable.  IMPRESSION: LEFT lower lobe consolidation consistent with pneumonia.  Subsegmental  atelectasis lingula.   Electronically Signed   By: Ulyses Southward M.D.   On: 04/25/2015 14:56     EKG Interpretation   Date/Time:  Saturday Apr 25 2015 14:30:26 EDT Ventricular Rate:  117 PR Interval:  115 QRS Duration: 82 QT Interval:  304 QTC Calculation: 424 R Axis:   57 Text Interpretation:  Sinus tachycardia Borderline ST elevation, anterior  leads No old tracing to compare Abnormal ekg Confirmed by Annelie Boak  MD,  Vishruth Seoane ([id number]) on 04/25/2015 3:54:50 PM      MDM   Final diagnoses:  Sepsis, due to unspecified organism  CAP (community acquired pneumonia)    The patient has significant findings including those concerning for sepsis with history of significant fevers, tachycardia, hypotension, tachypnea and hypoxia. His lactic acid is at 4.4. Given his lactic acidosis and underlying x-ray today showing a left lower lobe consolidation the patient likely has a sepsis syndrome consistent with community-acquired pneumonia and sepsis. He is critically ill, he will require antibiotics to treat sepsis, he has improved blood pressure with IV fluids, 30 mL/kg have been ordered, he will be admitted to the internal  medicine service, high level of care, the patient is septic. Critical care provided  D/w Dr. Goodrich who will admit.  WBC > Irene Limbo20,000 Tachycardia Hypoxic and Tachypneic  CRITICAL CARE Performed by: Vida RollerMILLER,Melaina Howerton D Total critical care time: 35 Critical care time was exclusive of separately billable procedures and treating other patients. Critical care was necessary to treat or prevent imminent or life-threatening deterioration. Critical care was time spent personally by me on the following activities: development of treatment plan with patient and/or surrogate as well as nursing, discussions with consultants, evaluation of patient's response to treatment, examination of patient, obtaining history from patient or surrogate, ordering and performing treatments and interventions, ordering and  review of laboratory studies, ordering and review of radiographic studies, pulse oximetry and re-evaluation of patient's condition.  Meds given in ED:  Medications  sodium chloride 0.9 % bolus 1,000 mL (1,000 mLs Intravenous New Bag/Given 04/25/15 1517)  sodium chloride 0.9 % bolus 1,000 mL (1,000 mLs Intravenous New Bag/Given 04/25/15 1540)  sodium chloride 0.9 % bolus 1,000 mL (1,000 mLs Intravenous New Bag/Given 04/25/15 1517)  sodium chloride 0.9 % bolus 1,000 mL (1,000 mLs Intravenous New Bag/Given 04/25/15 1517)  cefTRIAXone (ROCEPHIN) 1 g in dextrose 5 % 50 mL IVPB (1 g Intravenous New Bag/Given 04/25/15 1527)  azithromycin (ZITHROMAX) tablet 500 mg (500 mg Oral Given 04/25/15 1518)         Eber HongBrian Janisse Ghan, MD 04/25/15 [phone]

## 2015-04-25 NOTE — ED Notes (Signed)
Lactic Acid= 2.85, MD Criss AlvineGoldston notified

## 2015-04-25 NOTE — ED Notes (Signed)
I offered pt pain medication for his back but he stated he didn't want' any.

## 2015-04-25 NOTE — ED Notes (Signed)
Report from Solectron Corporationmber RN,  Pt is code sepsis,  Awaiting decision on further care,  Pt is stable,  Family at bedside

## 2015-04-25 NOTE — Progress Notes (Signed)
ANTIBIOTIC CONSULT NOTE - INITIAL  Pharmacy Consult for ceftriaxone and azithromycin Indication: CAP  No Known Allergies   Vital Signs: Temp: 97.6 F (36.4 C) (05/07 1402) Temp Source: Oral (05/07 1402) BP: 104/61 mmHg (05/07 1945) Pulse Rate: 132 (05/07 1945) Intake/Output from previous day:   Intake/Output from this shift:    Labs:  Recent Labs  04/25/15 1454  WBC 20.9*  HGB 14.7  PLT 253  CREATININE 0.88   Estimated Creatinine Clearance: 132 mL/min (by C-G formula based on Cr of 0.88). No results for input(s): VANCOTROUGH, VANCOPEAK, VANCORANDOM, GENTTROUGH, GENTPEAK, GENTRANDOM, TOBRATROUGH, TOBRAPEAK, TOBRARND, AMIKACINPEAK, AMIKACINTROU, AMIKACIN in the last 72 hours.   Microbiology: No results found for this or any previous visit (from the past 720 hour(s)).  Medical History: History reviewed. No pertinent past medical history.  Medications:  See med rec  Assessment: Patient's a 24 y.o M presents to the ED today with c/o cough, SOB, and weakness.  She was recently prescribed azithromycin with her ED visit on 5/5 for suspected respiratory infection but has not started on it.  CXR today shows left lower lobe consolidation consistent with PNA.  To start azithromycin and ceftriaxone for infection  Goal of Therapy:  Eradication of infection  Plan:  - ceftriaxone 1gm IV q24h - azithromycin 500 mg PO q24h - pharmacy will sign off as no renal adjustment is needed for these abx - Thank you for asking pharmacy participate in this patient's care.  Re-consult us if need further assistance.  Dorna Leitzham, Macari Zalesky P 04/25/2015,9:22 PM

## 2015-04-25 NOTE — ED Notes (Signed)
Pt states he feels better than he did,  He is just hot and looks flushed,  Per his request temperature in room decreased,  Pt advised if he needs anything to please let me know,  Pt denies pain of any kind at this time

## 2015-04-25 NOTE — H&P (Signed)
History and Physical  Jon Hill MWU:[id number] DOB: 1991-04-11 DOA: 04/25/2015  Referring physician: Dr. Hyacinth MeekerMiller in ED PCP: No primary care provider on file.   Chief Complaint: Weak and short of breath  HPI:  24 year old man with no significant past medical history except tobacco use who presents to the emergency department with 2+ week history of upper respiratory symptoms, cough, congestion and shortness of breath. Evaluation in the emergency department revealed dense left lower lobe pneumonia, lactic acidosis and leukocytosis consistent with sepsis/pneumonia. Also noted to be tachycardic with low normal blood pressure but no hypoxia. He was treated with ceftriaxone and azithromycin and referred for admission.  Seen in ED for same 4/25, 5/4 (treated with Zithromax), 5/5 and discharged.  Patient had ongoing symptoms for the last 2+ weeks. Has had fever. Increasing shortness of breath, low volume hemoptysis (bloody streaks), no specific aggravating or alleviating symptoms. He has become progressively weaker and has great fatigue and generalized weakness. He was recently seen in the emergency department and given a prescription for Zithromax which she has not yet started. He has never had pneumonia before and has not been sick in the past. He denies past medical history of any note.  In the emergency department afebrile today (101.8 5/5), HR 139 SBP 93, 97% on RA. Lactic acid 4.4. WBC 20.9, CMP unremarkable. BC, UC pending. CXR LLL pneumonia.   Review of Systems:  Negative for visual changes, sore throat, rash, new muscle aches, dysuria, bleeding, n/v/abdominal pain.  Positive for fever, hemoptysis, left-sided chest pain and right upper chest pain  History reviewed. No pertinent past medical history.  None   Past Surgical History  Procedure Laterality Date  . None      Social History:  reports that he has been smoking.  He does not have any smokeless tobacco history on file. He reports  that he does not drink alcohol. His drug history is not on file.  Self-care  No Known Allergies  No family history on file. None reported  Prior to Admission medications   Medication Sig Start Date End Date Taking? Authorizing Provider  albuterol (PROVENTIL HFA;VENTOLIN HFA) 108 (90 BASE) MCG/ACT inhaler Inhale 2 puffs into the lungs every 4 (four) hours as needed for wheezing or shortness of breath.   Yes Historical Provider, MD  benzonatate (TESSALON) 100 MG capsule Take 2 capsules (200 mg total) by mouth 2 (two) times daily as needed for cough. 04/23/15  Yes Harle BattiestElizabeth Tysinger, NP  guaiFENesin 200 MG tablet Take 1 tablet (200 mg total) by mouth every 4 (four) hours as needed for cough or to loosen phlegm. 04/13/15  Yes April Palumbo, MD  HYDROcodone-acetaminophen (NORCO/VICODIN) 5-325 MG per tablet Take 1 tablet by mouth every 4 (four) hours as needed. 04/23/15  Yes Harle BattiestElizabeth Tysinger, NP  naproxen (NAPROSYN) 500 MG tablet Take 1 tablet (500 mg total) by mouth 2 (two) times daily. 04/23/15  Yes Harle BattiestElizabeth Tysinger, NP  azithromycin (ZITHROMAX) 250 MG tablet Take 1 tablet (250 mg total) by mouth daily. Take first 2 tablets together, then 1 every day until finished. Patient not taking: Reported on 04/25/2015 04/22/15   Jerelyn ScottMartha Linker, MD  cetirizine-pseudoephedrine (ZYRTEC-D) 5-120 MG per tablet Take 1 tablet by mouth 2 (two) times daily. Patient not taking: Reported on 04/25/2015 04/13/15   April Palumbo, MD   Physical Exam: Ceasar MonsFiled Vitals:   04/25/15 1402  BP: 93/57  Pulse: 139  Temp: 97.6 F (36.4 C)  TempSrc: Oral  Resp: 20  SpO2: 94%  General: Appears calm, acutely ill but not toxic. Moderate distress. Eyes: PERRL, normal lids, irises ENT: grossly normal hearing, lips & tongue Neck: Appears grossly normal. Cardiovascular: Tachycardic, regular rhythm, no m/r/g. No LE edema. Respiratory: Clear on the right. Decreased breath sounds in the left. Rhonchi in the left. Increased respiratory effort  but able to speak in full sentences. Mild to moderate respiratory distress. No wheezes or rales. Abdomen: soft, ntnd. Appears unremarkable on visual inspection. Skin: no rash or induration seen  Musculoskeletal: grossly normal tone BUE/BLE Psychiatric: grossly normal mood and affect, speech fluent and appropriate Neurologic: grossly non-focal.  Wt Readings from Last 3 Encounters:  04/22/15 81.647 kg (180 lb)    Labs on Admission:  Basic Metabolic Panel:  Recent Labs Lab 04/25/15 1454  NA 139  K 4.0  CL 106  CO2 24  GLUCOSE 94  BUN 12  CREATININE 0.88  CALCIUM 8.3*    Liver Function Tests:  Recent Labs Lab 04/25/15 1454  AST 26  ALT 25  ALKPHOS 112  BILITOT 0.7  PROT 6.4*  ALBUMIN 2.8*   CBC:  Recent Labs Lab 04/25/15 1454  WBC 20.9*  NEUTROABS PENDING  HGB 14.7  HCT 42.1  MCV 85.1  PLT 253   Lactic acid 4.4  Radiological Exams on Admission: Dg Chest Port 1 View  04/25/2015   CLINICAL DATA:  Shortness of breath, cough, and weakness for 2 weeks, was diagnosed with bronchitis ; today presents with tachycardia, systolic BP of 90's, smoker  EXAM: PORTABLE CHEST - 1 VIEW  COMPARISON:  Portable exam 1443 hr compared to 04/22/2015  FINDINGS: Normal heart size, mediastinal contours and pulmonary vascularity.  LEFT lower lobe consolidation consistent with pneumonia new since prior exam.  Linear subsegmental atelectasis lower LEFT chest at lingula.  Remaining lungs clear.  No pleural effusion or pneumothorax.  Osseous structures unremarkable.  IMPRESSION: LEFT lower lobe consolidation consistent with pneumonia.  Subsegmental atelectasis lingula.   Electronically Signed   By: Ulyses SouthwardMark  Boles M.D.   On: 04/25/2015 14:56    EKG: Independently reviewed. ST no acute changes.   Principal Problem:   Community acquired pneumonia Active Problems:   Sepsis   Tobacco dependence   Assessment/Plan 1. Sepsis secondary to LLL pneumonia with low-volume hemoptysis. Rapid HIV  negative. 2. Tobacco dependence   Appears ill, tachypneic, tachycardia. No hypoxia. Respiratory status stable at this point. Symptoms, signs and laboratory studies consistent with sepsis.  Admit to stepdown unit. Aggressive volume, broad-spectrum antibiotics  Trend lactic acid.  Influenza panel pending  F/u culture data. Urinary antigens for Streptococcus pneumoniae and Legionella. Sputum culture.  Code Status: full code  DVT prophylaxis: SCDs Family Communication: pt alert and has capacity. Speaks fluent [place]. He spoke to his mother who speaks [place]. Disposition Plan/Anticipated LOS: admit, 3 days  Time spent: 60 minutes  Brendia Sacksaniel Rion Catala, MD  [facility] Pager [phone] 04/25/2015, 3:49 PM

## 2015-04-25 NOTE — ED Notes (Signed)
Pt states he has had cough and weakness x 2 wks.  Was seen twice for same.  Dx w/ bronchitis.  Today presents w/ HR nearing 140 at rest and a soft systolic BP of 90s.

## 2015-04-25 NOTE — ED Notes (Signed)
Pt's blood pressure dropped to 81/47 heart rate 148,  O2 sats 89 % on 2 L O2 per [place],,  Pt is stating his back hurts really bad,  MD at bedside and Dr Leighton Roachalloway on phone aware of situation and charge nurse Tiffany Rn at bedside with this Clinical research associatewriter and [place] NT,  Follow new order of one liter of NS,  If blood pressure doesn't increase then call D r Calloway and will consider pressers.

## 2015-04-26 DIAGNOSIS — R7881 Bacteremia: Secondary | ICD-10-CM | POA: Diagnosis present

## 2015-04-26 LAB — BASIC METABOLIC PANEL
ANION GAP: 7 (ref 5–15)
BUN: 8 mg/dL (ref 6–20)
CO2: 25 mmol/L (ref 22–32)
Calcium: 7.9 mg/dL — ABNORMAL LOW (ref 8.9–10.3)
Chloride: 110 mmol/L (ref 101–111)
Creatinine, Ser: 0.68 mg/dL (ref 0.61–1.24)
GFR calc Af Amer: >60 mL/min (ref >60)
Glucose, Bld: 82 mg/dL (ref 70–99)
POTASSIUM: 3.3 mmol/L — AB (ref 3.5–5.1)
SODIUM: 142 mmol/L (ref 135–145)

## 2015-04-26 LAB — STREP PNEUMONIAE URINARY ANTIGEN: STREP PNEUMO URINARY ANTIGEN: NEGATIVE

## 2015-04-26 LAB — LACTIC ACID, PLASMA: LACTIC ACID, VENOUS: 1.4 mmol/L (ref 0.5–2.0)

## 2015-04-26 LAB — CBC
HCT: 35.7 % — ABNORMAL LOW (ref 39.0–52.0)
Hemoglobin: 12 g/dL — ABNORMAL LOW (ref 13.0–17.0)
MCH: 28.4 pg (ref 26.0–34.0)
MCHC: 33.6 g/dL (ref 30.0–36.0)
MCV: 84.6 fL (ref 78.0–100.0)
Platelets: 203 10*3/uL (ref 150–400)
RBC: 4.22 MIL/uL (ref 4.22–5.81)
RDW: 13.6 % (ref 11.5–15.5)
WBC: 18.4 10*3/uL — AB (ref 4.0–10.5)

## 2015-04-26 LAB — MRSA PCR SCREENING: MRSA BY PCR: NEGATIVE

## 2015-04-26 LAB — URINE CULTURE

## 2015-04-26 MED ORDER — VANCOMYCIN HCL IN DEXTROSE 1-5 GM/200ML-% IV SOLN
1000.0000 mg | Freq: Three times a day (TID) | INTRAVENOUS | Status: DC
  Administered 2015-04-26 – 2015-04-29 (×10): 1000 mg via INTRAVENOUS
  Filled 2015-04-26 (×12): qty 200

## 2015-04-26 MED ORDER — POTASSIUM CHLORIDE CRYS ER 20 MEQ PO TBCR
40.0000 meq | EXTENDED_RELEASE_TABLET | ORAL | Status: AC
  Administered 2015-04-26 (×2): 40 meq via ORAL
  Filled 2015-04-26 (×2): qty 2

## 2015-04-26 NOTE — Progress Notes (Signed)
LAB RESULTS ON BLOOD CULTURES: 2 AEROBIC BOTTLES POSITIVE FOR GRAM POSITIVE COCCI IN PAIRS AND CHAINS

## 2015-04-26 NOTE — Progress Notes (Signed)
[facility] Was in the wrong chart when Medical Necessity form was completed. Spoke with IT they explained how to correct the error and it was done according to instructions.  Information was deleted from patient's chart.

## 2015-04-26 NOTE — Progress Notes (Signed)
ANTIBIOTIC CONSULT NOTE - INITIAL  Pharmacy Consult for Vancomycin Indication: +GPC bacteremia  No Known Allergies   Vital Signs: Temp: 98.8 F (37.1 C) (05/07 2214) Temp Source: Oral (05/07 2214) BP: 119/71 mmHg (05/08 0400) Pulse Rate: 117 (05/08 0400) Intake/Output from previous day: 05/07 0701 - 05/08 0700 In: 4212.5 [I.V.:4212.5] Out: -  Intake/Output from this shift: Total I/O In: 162.5 [I.V.:162.5] Out: -   Labs:  Recent Labs  04/25/15 1454  WBC 20.9*  HGB 14.7  PLT 253  CREATININE 0.88   Estimated Creatinine Clearance: 121.2 mL/min (by C-G formula based on Cr of 0.88). No results for input(s): VANCOTROUGH, VANCOPEAK, VANCORANDOM, GENTTROUGH, GENTPEAK, GENTRANDOM, TOBRATROUGH, TOBRAPEAK, TOBRARND, AMIKACINPEAK, AMIKACINTROU, AMIKACIN in the last 72 hours.   Microbiology: Recent Results (from the past 720 hour(s))  Blood culture (routine x 2)     Status: None (Preliminary result)   Collection Time: 04/25/15  3:00 PM  Result Value Ref Range Status   Specimen Description BLOOD BLOOD LEFT FOREARM  Final   Special Requests BOTTLES DRAWN AEROBIC AND ANAEROBIC 5 CC EACH  Final   Culture   Final    GRAM POSITIVE COCCI IN PAIRS AND CHAINS Note: Gram Stain Report Called to,Read Back By and Verified With: Broadus Johnheryl D. RN on 04/26/15 at 04:47 by Christie NottinghamAnne Skeen Performed at [facility]    Report Status PENDING  Incomplete  Blood culture (routine x 2)     Status: None (Preliminary result)   Collection Time: 04/25/15  3:01 PM  Result Value Ref Range Status   Specimen Description BLOOD BLOOD RIGHT FOREARM  Final   Special Requests BOTTLES DRAWN AEROBIC AND ANAEROBIC 5 CC EACH  Final   Culture   Final    GRAM POSITIVE COCCI IN PAIRS AND CHAINS Note: Gram Stain Report Called to,Read Back By and Verified With: Broadus Johnheryl D. RN on 04/26/15 at 04:47 by Christie NottinghamAnne Skeen Performed at [facility]    Report Status PENDING  Incomplete  Culture, sputum-assessment     Status: None    Collection Time: 04/25/15  4:55 PM  Result Value Ref Range Status   Specimen Description SPUTUM  Final   Special Requests NONE  Final   Sputum evaluation   Final    THIS SPECIMEN IS ACCEPTABLE. RESPIRATORY CULTURE REPORT TO FOLLOW.   Report Status 04/25/2015 FINAL  Final  MRSA PCR Screening     Status: None   Collection Time: 04/26/15  2:59 AM  Result Value Ref Range Status   MRSA by PCR NEGATIVE NEGATIVE Final    Comment:        The GeneXpert MRSA Assay (FDA approved for NASAL specimens only), is one component of a comprehensive MRSA colonization surveillance program. It is not intended to diagnose MRSA infection nor to guide or monitor treatment for MRSA infections.     Medical History: History reviewed. No pertinent past medical history.  Medications:  See med rec  Assessment: Patient's a 24 y.o M presents to the ED on 5/7 with c/o cough, SOB, and weakness.  She was recently prescribed azithromycin with her ED visit on 5/5 for suspected respiratory infection but has not started on it.  CXR today shows left lower lobe consolidation consistent with PNA.  To start azithromycin and ceftriaxone for infection  5/8:  Blood cultures showing +GPC.  Pharmacy consulted to add Vancomycin for coverage of bacteremia  Goal of Therapy:  Eradication of infection Vancomycin trough: 15-20 mcg/ml  Plan:  - Continue ceftriaxone 1gm  IV q24h - Continue azithromycin 500 mg PO q24h - Begin Vancomycin 1gm IV q8h  Maryellen PilePoindexter, Tamrah Victorino Trefz, PharmD 04/26/2015,5:02 AM

## 2015-04-26 NOTE — Progress Notes (Signed)
CRITICAL VALUE ALERT  Critical value received: blood culture- Aerobic bottles x2 with gram positive cocci in pairs and chains  Date of notification:  04/26/2015  Time of notification:  0445  Critical value read back: yes  Nurse who received alert:  Kevan Nyheryl Tasheena Wambolt RN  MD notified (1st page):  Lenny Pastelom Callahan NP  Time of first page:  [phone]  MD notified (2nd page):  Time of second page:  Responding MD: Lenny Pastelom Callahan NP  Time MD responded:  0500

## 2015-04-26 NOTE — Progress Notes (Signed)
PROGRESS NOTE  Jon Hill ZOX:[id number] DOB: 1991/09/28 DOA: 04/25/2015 PCP: No primary care provider on file.  Summary: 24 year old man with no significant past history presented with 2 week history of upper respiratory symptoms and shortness of breath. Chest x-ray revealed dense left lower lobe pneumonia, laboratory studies revealed lactic acidosis and leukocytosis. Admitted for sepsis secondary to. Early in admission blood cultures 2/2 positive for gram-positive cocci.  Assessment/Plan: 1. Sepsis secondary to community acquired pneumonia with gram-positive bacteremia. HIV negative.  2. Acute hypoxic respiratory failure. On 4L [facility] with good sats. 3. Tobacco dependence.   Remains critically ill but hemodynamics stable. SPO2 stable on 4 L. Tachypneic but appears better compared to yesterday.  Plan to continue triple antibiotic therapy, follow-up blood cultures.  Follow-up basic metabolic panel and lactic acid today  Keep in SDU  Code Status: full code DVT prophylaxis: Lovenox Family Communication: alert, has capacity, plan d/w patient Disposition Plan: home  Brendia Sacksaniel Jarry Manon, MD  [facility]  Pager [phone] If 7PM-7AM, please contact night-coverage at [email or web address], password [email or web address] 04/26/2015, 7:09 AM  LOS: 1 day   Consultants:    Procedures:    Antibiotics:  Ceftriaxone 5/7 >>  Azithromycin 5/7 >>  Vancomycin 5/8 >>  HPI/Subjective: Blood cultures 2/2 positive for gram-positive cocci. Started on vancomycin. Hypoxic.  Discussed with the nurse. Patient resting more comfortably now and less tachypneic. Overall improved.  Patient feels better. He still short of breath and unable to lie flat. He denies pain. No nausea or vomiting. No other complaints.  Objective: Filed Vitals:   04/26/15 0315 04/26/15 0330 04/26/15 0345 04/26/15 0400  BP: 115/74 115/67 118/59 119/71  Pulse: 114 125 119 117  Temp:      TempSrc:      Resp: 21 24    Height:      Weight:        SpO2: 98% 97% 99% 98%    Intake/Output Summary (Last 24 hours) at 04/26/15 0709 Last data filed at 04/26/15 0600  Gross per 24 hour  Intake 4862.5 ml  Output      0 ml  Net 4862.5 ml     Filed Weights   04/26/15 0255  Weight: 80.9 kg (178 lb 5.6 oz)    Exam:     Afebrile, tachycardic, tachypneic, normotensive SPO2 98% on 4 L. General: Appears calm, ill but not toxic. Eyes: Pupils, irises, lids appear unremarkable ENT: grossly normal hearing, lips & tongue Neck: Appears grossly normal Cardiovascular: RRR, no m/r/g. No LE edema.  Telemetry: ST, no arrhythmias  Respiratory: Clear to auscultation. No wheezes, rales or rhonchi. Moderate increased respiratory effort. Speaks in full sentences. No retractions or abdominal breathing. Abdomen: soft, ntnd Skin: no rash or induration noted. Warm and dry. Perfusion appears grossly normal. Musculoskeletal: grossly normal tone BUE/BLE Psychiatric: grossly normal mood and affect, speech fluent and appropriate Neurologic: grossly non-focal.  New data reviewed:  Lactic acid 4.4 >> 2.65 >> 2.6  Influenza PCR negative  Repeat lactic acid, and basic metabolic panel pending  WBC 20.9 >> 18.4  Pertinent data since admission:  5/7: Chest x-ray left lower lobe consolidation  Pending data:  Urine culture  Sputum culture  Blood cultures 2/2 gram-positive cocci in pairs and chains  Scheduled Meds: . azithromycin  500 mg Oral Q24H  . cefTRIAXone (ROCEPHIN)  IV  1 g Intravenous Q24H  . enoxaparin (LOVENOX) injection  40 mg Subcutaneous Q24H  . sodium chloride  3 mL Intravenous Q12H  . vancomycin  1,000 mg  Intravenous Q8H   Continuous Infusions: . sodium chloride 150 mL/hr at 04/26/15 [id number]    Principal Problem:   Sepsis due to pneumonia Active Problems:   Sepsis   Community acquired pneumonia   Tobacco dependence   Gram-positive cocci bacteremia   Time spent 30 minutes

## 2015-04-27 ENCOUNTER — Encounter (HOSPITAL_COMMUNITY): Payer: Self-pay

## 2015-04-27 DIAGNOSIS — D6489 Other specified anemias: Secondary | ICD-10-CM

## 2015-04-27 DIAGNOSIS — R7881 Bacteremia: Secondary | ICD-10-CM

## 2015-04-27 LAB — CBC
HCT: 34.3 % — ABNORMAL LOW (ref 39.0–52.0)
Hemoglobin: 11.6 g/dL — ABNORMAL LOW (ref 13.0–17.0)
MCH: 28.7 pg (ref 26.0–34.0)
MCHC: 33.8 g/dL (ref 30.0–36.0)
MCV: 84.9 fL (ref 78.0–100.0)
Platelets: 234 10*3/uL (ref 150–400)
RBC: 4.04 MIL/uL — ABNORMAL LOW (ref 4.22–5.81)
RDW: 13.7 % (ref 11.5–15.5)
WBC: 11.4 10*3/uL — ABNORMAL HIGH (ref 4.0–10.5)

## 2015-04-27 LAB — BASIC METABOLIC PANEL
Anion gap: 7 (ref 5–15)
BUN: 11 mg/dL (ref 6–20)
CO2: 26 mmol/L (ref 22–32)
Calcium: 8.4 mg/dL — ABNORMAL LOW (ref 8.9–10.3)
Chloride: 108 mmol/L (ref 101–111)
Creatinine, Ser: 0.69 mg/dL (ref 0.61–1.24)
Glucose, Bld: 91 mg/dL (ref 70–99)
Potassium: 3.2 mmol/L — ABNORMAL LOW (ref 3.5–5.1)
SODIUM: 141 mmol/L (ref 135–145)

## 2015-04-27 LAB — LEGIONELLA ANTIGEN, URINE

## 2015-04-27 LAB — MAGNESIUM: Magnesium: 1.8 mg/dL (ref 1.7–2.4)

## 2015-04-27 MED ORDER — POTASSIUM CHLORIDE 10 MEQ/100ML IV SOLN
10.0000 meq | INTRAVENOUS | Status: AC
  Administered 2015-04-27 (×2): 10 meq via INTRAVENOUS
  Filled 2015-04-27 (×2): qty 100

## 2015-04-27 MED ORDER — POTASSIUM CHLORIDE CRYS ER 20 MEQ PO TBCR
40.0000 meq | EXTENDED_RELEASE_TABLET | Freq: Once | ORAL | Status: AC
  Administered 2015-04-27: 40 meq via ORAL
  Filled 2015-04-27: qty 2

## 2015-04-27 MED ORDER — CETYLPYRIDINIUM CHLORIDE 0.05 % MT LIQD
7.0000 mL | Freq: Two times a day (BID) | OROMUCOSAL | Status: DC
  Administered 2015-04-28 – 2015-04-29 (×2): 7 mL via OROMUCOSAL

## 2015-04-27 NOTE — Progress Notes (Signed)
  PROGRESS NOTE  Jon Hill UJW:[id number] DOB: Sep 23, 1991 DOA: 04/25/2015 PCP: No primary care provider on file.  Summary: 24 year old man with no significant past history presented with 2 week history of upper respiratory symptoms and shortness of breath. Chest x-ray revealed dense left lower lobe pneumonia, laboratory studies revealed lactic acidosis and leukocytosis. Admitted for sepsis secondary to. Early in admission blood cultures 2/2 positive for gram-positive cocci.  Assessment/Plan: 1. Sepsis secondary to community acquired pneumonia with gram-positive bacteremia, gram-positive sputum culture. Rapidly improving. HIV negative.  2. Acute hypoxic respiratory failure. Resolved. Respiratory effort markedly decreased. 3. Anemia of acute illness. Stable. 4. Tobacco dependence.   Dramatically improved. Hypoxia has resolved.  Plan continue antibiotics pending sensitivities.  Replace potassium   Transfer to medical floor.  Code Status: full code DVT prophylaxis: Lovenox Family Communication: alert, has capacity, plan d/w patient again 5/9 Disposition Plan: home, potentially 5/11  Brendia Sacksaniel Brandun Pinn, MD  [facility]  Pager [phone] If 7PM-7AM, please contact night-coverage at [email or web address], password [email or web address] 04/27/2015, 7:40 AM  LOS: 2 days   Consultants:    Procedures:    Antibiotics:  Ceftriaxone 5/7 >>  Azithromycin 5/7 >>  Vancomycin 5/8 >>  HPI/Subjective: No issues overnight.  "I feel much better". Breathing better. No pain.  Objective: Filed Vitals:   04/27/15 0400 04/27/15 0500 04/27/15 0600 04/27/15 0700  BP: 146/97 142/97 134/90 133/97  Pulse: 87 80 87 83  Temp: 98.5 F (36.9 C)     TempSrc: Oral     Resp: 8 0  35  Height:      Weight:      SpO2: 98% 99% 92% 98%    Intake/Output Summary (Last 24 hours) at 04/27/15 0740 Last data filed at 04/27/15 0000  Gross per 24 hour  Intake 2467.5 ml  Output   1225 ml  Net 1242.5 ml     Filed Weights    04/26/15 0255  Weight: 80.9 kg (178 lb 5.6 oz)    Exam:     Afebrile, vital signs stable. No tachycardia. Normotensive. SPO2 100% on room air. General:  Appears calm and comfortable. Appears much better today. Cardiovascular: RRR, no m/r/g. No LE edema. Telemetry: SR, no arrhythmias  Respiratory: CTA bilaterally, no w/r/r. Normal respiratory effort. Psychiatric: grossly normal mood and affect, speech fluent and appropriate  New data reviewed:  Potassium 3.2, basic metabolic panel otherwise unremarkable, magnesium within normal limits.  WBC 20.9 >> 18.4 >> 11.4  Hemoglobin stable 11.6.  Pertinent data since admission:  5/7: Chest x-ray left lower lobe consolidation  Pending data:  Sputum culture MODERATE GRAM POSITIVE COCCI IN PAIRS AND CHAINS  Blood cultures 2/2 gram-positive cocci in pairs and chains  Scheduled Meds: . azithromycin  500 mg Oral Q24H  . cefTRIAXone (ROCEPHIN)  IV  1 g Intravenous Q24H  . enoxaparin (LOVENOX) injection  40 mg Subcutaneous Q24H  . potassium chloride  10 mEq Intravenous Q1 Hr x 2  . sodium chloride  3 mL Intravenous Q12H  . vancomycin  1,000 mg Intravenous Q8H   Continuous Infusions: . sodium chloride 100 mL/hr at 04/26/15 2000    Principal Problem:   Sepsis due to pneumonia Active Problems:   Sepsis   Community acquired pneumonia   Tobacco dependence   Gram-positive cocci bacteremia   Time spent 20 minutes

## 2015-04-27 NOTE — Progress Notes (Signed)
Date:  Apr 27, 2015 U.R. performed for needs and level of care. Will continue to follow for Case Management needs.  Paola Flynt, RN, BSN, CCM   [phone] 

## 2015-04-28 ENCOUNTER — Encounter (HOSPITAL_COMMUNITY): Payer: Self-pay

## 2015-04-28 LAB — CULTURE, BLOOD (ROUTINE X 2)

## 2015-04-28 LAB — CULTURE, RESPIRATORY W GRAM STAIN: Culture: NORMAL

## 2015-04-28 LAB — CULTURE, RESPIRATORY

## 2015-04-28 NOTE — Progress Notes (Signed)
  PROGRESS NOTE  Jon Hill [id number] DOB: 21-Sep-1991 DOA: 04/25/2015 PCP: No primary care provider on file.  Summary: 24 year old man with no significant past history presented with 2 week history of upper respiratory symptoms and shortness of breath. Chest x-ray revealed dense left lower lobe pneumonia, laboratory studies revealed lactic acidosis and leukocytosis. Admitted for sepsis secondary to. Early in admission blood cultures 2/2 positive for gram-positive cocci.  Assessment/Plan: 1. Sepsis secondary to community acquired pneumonia with gram-positive bacteremia, gram-positive sputum culture. Continues to improve daily. 2. Acute hypoxic respiratory failure. Resolved.  3. Anemia of acute illness.  4. Tobacco dependence.   Much improved. Await culture data to guide outpatient antibiotic therapy. Repeat blood cultures obtained today 5/10 to document clearance of bacteremia.  Once initial culture data are obtained, plan transition home on appropriate antibiotic therapy.  Code Status: full code DVT prophylaxis: Lovenox Family Communication: alert, has capacity, plan d/w patient again 5/10 Disposition Plan: home, potentially 5/11  Brendia Sacksaniel Jann Ra, MD  [facility]  Pager [phone] If 7PM-7AM, please contact night-coverage at [email or web address], password [email or web address] 04/28/2015, 1:53 PM  LOS: 3 days   Consultants:    Procedures:    Antibiotics:  Ceftriaxone 5/7 >>  Azithromycin 5/7 >>  Vancomycin 5/8 >>  HPI/Subjective: Rapidly improving. Breathing well. No pain, no complaints.  Objective: Filed Vitals:   04/27/15 1127 04/27/15 1417 04/27/15 2221 04/28/15 0512  BP: 138/89 136/83 139/69 136/87  Pulse: 100 113 98 97  Temp: 97.8 F (36.6 C) 98.2 F (36.8 C) 98.5 F (36.9 C) 98.5 F (36.9 C)  TempSrc: Oral Oral Oral Oral  Resp: 20 20 21 20   Height:      Weight:      SpO2: 100% 100% 99% 97%    Intake/Output Summary (Last 24 hours) at 04/28/15 1353 Last data filed  at 04/28/15 [id number]  Gross per 24 hour  Intake   1130 ml  Output      0 ml  Net   1130 ml     Filed Weights   04/26/15 0255  Weight: 80.9 kg (178 lb 5.6 oz)    Exam:     Afebrile, VSS. SPO2 97% on room air. General:  Appears comfortable, calm. Well-appearing. Cardiovascular: Regular rate and rhythm, no murmur, rub or gallop. No lower extremity edema. Respiratory: Clear to auscultation bilaterally, no wheezes, rales or rhonchi. Normal respiratory effort. Psychiatric: grossly normal mood and affect, speech fluent and appropriate  New data reviewed:  No new data  Pertinent data since admission:  5/7: Chest x-ray left lower lobe consolidation  Pending data:  Sputum culture MODERATE GRAM POSITIVE COCCI IN PAIRS AND CHAINS  Blood cultures 2/2 gram-positive cocci in pairs and chains  5/10 repeat blood cultures pending  Scheduled Meds: . antiseptic oral rinse  7 mL Mouth Rinse BID  . azithromycin  500 mg Oral Q24H  . cefTRIAXone (ROCEPHIN)  IV  1 g Intravenous Q24H  . enoxaparin (LOVENOX) injection  40 mg Subcutaneous Q24H  . sodium chloride  3 mL Intravenous Q12H  . vancomycin  1,000 mg Intravenous Q8H   Continuous Infusions:    Principal Problem:   Sepsis due to pneumonia Active Problems:   Sepsis   Community acquired pneumonia   Tobacco dependence   Gram-positive cocci bacteremia   Anemia due to other cause   Time spent 15 minutes

## 2015-04-29 DIAGNOSIS — D72829 Elevated white blood cell count, unspecified: Secondary | ICD-10-CM

## 2015-04-29 DIAGNOSIS — B953 Streptococcus pneumoniae as the cause of diseases classified elsewhere: Secondary | ICD-10-CM

## 2015-04-29 DIAGNOSIS — E876 Hypokalemia: Secondary | ICD-10-CM

## 2015-04-29 MED ORDER — DEXTROSE 5 % IV SOLN: 1.0000 g | INTRAVENOUS | Status: AC

## 2015-04-29 MED ORDER — LEVOFLOXACIN 750 MG PO TABS: 750.0000 mg | ORAL_TABLET | Freq: Every day | ORAL | Status: AC

## 2015-04-29 MED ORDER — ALBUTEROL SULFATE HFA 108 (90 BASE) MCG/ACT IN AERS: 2.0000 | INHALATION_SPRAY | RESPIRATORY_TRACT | Status: AC | PRN

## 2015-04-29 NOTE — Clinical Documentation Improvement (Signed)
Supporting Information: Replace potassium per 5/09 progress notes.  Abnormal Lab and/or Testing Results: Potassium: 5/08: 3.3  Treatment provided: 5/09:  Kdur 40 meq po.   Possible Clinical Conditions: >  Hypokalemia >  Other >  Not able to determine   Thank you, Debria GarretWanda Mathews-Bethea,RN,BSN,Clinical Documentation Specialist [phone] [email or web address]

## 2015-04-29 NOTE — Discharge Summary (Signed)
Physician Discharge Summary  Jon Hill URO:[id number] DOB: 1991-05-04 DOA: 04/25/2015  PCP: No primary care provider on file.; CM to set up appt with [facility]   Admit date: 04/25/2015 Discharge date: 04/29/2015  Recommendations for Outpatient Follow-up:  1. Please follow up  With primary care physician per scheduled appointment 2. Take Levaquin for 7 days on discharge as prescribed.  Per infectious disease patient also requires 7 days of IV Rocephin which can be administered through IV line.   Discharge Diagnoses:  Principal Problem:   Sepsis due to pneumonia Active Problems:   Sepsis   Community acquired pneumonia   Tobacco dependence   Gram-positive cocci bacteremia   Anemia due to other cause    Discharge Condition: stable   Diet recommendation: as tolerated   History of present illness:  24 year old male with no significant past medical history who presented to [facility] with concern for worsening shortness of breath and upper respiratory tract symptoms for past 2 weeks prior to this admission. Chest x-ray on the admission showed dense left lower lobe pneumonia. In addition, patient had elevated lactic acid and white blood cell count on the admission. He was admitted for sepsis secondary to possible pneumonia.   Also note, the blood cultures on the admission grew Streptococcus pneumonia. Follow-up blood cultures show no growth to date.  I spoke with infectious disease, Dr. Ninetta Lights who recommended Rocephin and Levaquin for 7 days on discharge.   Hospital Course:   Assessment/Plan:  Principal problem: Sepsis secondary to Streptococcus pneumonia bacteremia and lobar pneumonia / Leukocytosis  -  Sepsis criteria met on the admission. Source of infection thought to be community-acquired  Pneumonia. Off note, blood cultures on the admission grew Streptococcus pneumoniae so sepsis most likely secondary to Streptococcus pneumoniae bacteremia. -  Patient was initially on  azithromycin and Rocephin. I spoke with Dr. Ninetta Lights off infectious disease who recommended Levaquin and Rocephin on discharge for 7 days. Home health order placed. Challenge is that patient has no insurance and we will have to see with case manager is this choice of antibiotics will work out on discharge.   Active problems: Acute respiratory failure with hypoxia /  Acute COPD exacerbation /  Smoker -  Respiratory status is stable at this time. Oxygen saturation is above 90% without oxygen support. Hypoxia likely secondary to COPD exacerbation and pneumonia. -  Smoking cessation counseling provided.  Hypokalemia - Secondary to sepsis - Supplemented    Code Status: full code DVT prophylaxis: Lovenox subQ ordered in hospital    Signed:  Manson Passey, MD  [facility] 04/29/2015, 9:30 AM  Pager #: [phone]  Time spent in minutes: more than 30 minutes   Discharge Exam: Filed Vitals:   04/29/15 0547  BP: 112/70  Pulse: 84  Temp: 98.1 F (36.7 C)  Resp: 20   Filed Vitals:   04/28/15 0512 04/28/15 1543 04/28/15 2117 04/29/15 0547  BP: 136/87 127/68 128/70 112/70  Pulse: 97 72 89 84  Temp: 98.5 F (36.9 C) 97.9 F (36.6 C) 98.3 F (36.8 C) 98.1 F (36.7 C)  TempSrc: Oral Oral Oral Oral  Resp: 20 20 20 20   Height:      Weight:      SpO2: 97% 100% 95% 96%    General: Pt is alert, follows commands appropriately, not in acute distress Cardiovascular: Regular rate and rhythm, S1/S2 +, no murmurs Respiratory: Clear to auscultation bilaterally, no wheezing, no crackles, no rhonchi Abdominal: Soft, non tender, non distended, bowel  sounds +, no guarding Extremities: no edema, no cyanosis, pulses palpable bilaterally DP and PT Neuro: Grossly nonfocal  Discharge Instructions  Discharge Instructions    Call MD for:  difficulty breathing, headache or visual disturbances    Complete by:  As directed      Call MD for:  persistant nausea and vomiting    Complete by:   As directed      Call MD for:  severe uncontrolled pain    Complete by:  As directed      Diet - low sodium heart healthy    Complete by:  As directed      Increase activity slowly    Complete by:  As directed             Medication List    STOP taking these medications        azithromycin 250 MG tablet  Commonly known as:  ZITHROMAX     benzonatate 100 MG capsule  Commonly known as:  TESSALON     cetirizine-pseudoephedrine 5-120 MG per tablet  Commonly known as:  ZYRTEC-D     HYDROcodone-acetaminophen 5-325 MG per tablet  Commonly known as:  NORCO/VICODIN      TAKE these medications        albuterol 108 (90 BASE) MCG/ACT inhaler  Commonly known as:  PROVENTIL HFA;VENTOLIN HFA  Inhale 2 puffs into the lungs every 4 (four) hours as needed for wheezing or shortness of breath.     guaiFENesin 200 MG tablet  Take 1 tablet (200 mg total) by mouth every 4 (four) hours as needed for cough or to loosen phlegm.     levofloxacin 750 MG tablet  Commonly known as:  LEVAQUIN  Take 1 tablet (750 mg total) by mouth daily.     naproxen 500 MG tablet  Commonly known as:  NAPROSYN  Take 1 tablet (500 mg total) by mouth 2 (two) times daily.           Follow-up Information    Follow up with [place]    . Schedule an appointment as soon as possible for a visit in 1 week.   Why:  Follow up appt after recent hospitalization   Contact information:   [address] [place] [address] [phone]       The results of significant diagnostics from this hospitalization (including imaging, microbiology, ancillary and laboratory) are listed below for reference.    Significant Diagnostic Studies: Dg Chest 2 View  04/22/2015   CLINICAL DATA:  Cough and fever for 2 weeks.  EXAM: CHEST  2 VIEW  COMPARISON:  04/12/2015  FINDINGS: The heart size and mediastinal contours are within normal limits. Chronic central peribronchial  thickening again noted. No evidence of pulmonary infiltrate or pleural effusion. No evidence of pulmonary hyperinflation. The visualized skeletal structures are unremarkable.  IMPRESSION: Chronic central peribronchial thickening.  No active lung disease.   Electronically Signed   By: Earle Gell M.D.   On: 04/22/2015 19:02   Dg Chest 2 View  04/12/2015   CLINICAL DATA:  Acute onset of cough and fever. Chest pain when coughing. Initial encounter.  EXAM: CHEST  2 VIEW  COMPARISON:  None.  FINDINGS: The lungs are well-aerated. Peribronchial thickening is noted. There is no evidence of focal opacification, pleural effusion or pneumothorax.  The heart is normal in size; the mediastinal contour is within normal limits. No acute osseous abnormalities are seen.  IMPRESSION: Peribronchial  thickening noted; lungs otherwise clear.   Electronically Signed   By: Garald Balding M.D.   On: 04/12/2015 23:24   Dg Chest Port 1 View  04/25/2015   CLINICAL DATA:  Shortness of breath, cough, and weakness for 2 weeks, was diagnosed with bronchitis ; today presents with tachycardia, systolic BP of 54'Y, smoker  EXAM: PORTABLE CHEST - 1 VIEW  COMPARISON:  Portable exam 1443 hr compared to 04/22/2015  FINDINGS: Normal heart size, mediastinal contours and pulmonary vascularity.  LEFT lower lobe consolidation consistent with pneumonia new since prior exam.  Linear subsegmental atelectasis lower LEFT chest at lingula.  Remaining lungs clear.  No pleural effusion or pneumothorax.  Osseous structures unremarkable.  IMPRESSION: LEFT lower lobe consolidation consistent with pneumonia.  Subsegmental atelectasis lingula.   Electronically Signed   By: Lavonia Dana M.D.   On: 04/25/2015 14:56    Microbiology: Recent Results (from the past 240 hour(s))  Blood culture (routine x 2)     Status: None   Collection Time: 04/25/15  3:00 PM  Result Value Ref Range Status   Specimen Description BLOOD BLOOD LEFT FOREARM  Final   Special Requests  BOTTLES DRAWN AEROBIC AND ANAEROBIC 5 CC EACH  Final   Culture   Final    STREPTOCOCCUS PNEUMONIAE Note: Gram Stain Report Called to,Read Back By and Verified With: Jerilee Hoh RN on 04/26/15 at 04:47 by Rise Mu Performed at [facility]    Report Status 04/28/2015 FINAL  Final   Organism ID, Bacteria STREPTOCOCCUS PNEUMONIAE  Final      Susceptibility   Streptococcus pneumoniae - MIC (ETEST)*    CEFTRIAXONE 0.023 SENSITIVE Sensitive     LEVOFLOXACIN 1.5 SENSITIVE Sensitive     PENICILLIN 0.032 SENSITIVE Sensitive     * STREPTOCOCCUS PNEUMONIAE  Blood culture (routine x 2)     Status: None   Collection Time: 04/25/15  3:01 PM  Result Value Ref Range Status   Specimen Description BLOOD BLOOD RIGHT FOREARM  Final   Special Requests BOTTLES DRAWN AEROBIC AND ANAEROBIC 5 CC EACH  Final   Culture   Final    STREPTOCOCCUS PNEUMONIAE Note: SUSCEPTIBILITIES PERFORMED ON PREVIOUS CULTURE WITHIN THE LAST 5 DAYS. Note: Gram Stain Report Called to,Read Back By and Verified With: Jerilee Hoh RN on 04/26/15 at 04:47 by Rise Mu Performed at [facility]    Report Status 04/28/2015 FINAL  Final  Urine culture     Status: None   Collection Time: 04/25/15  3:17 PM  Result Value Ref Range Status   Specimen Description URINE, RANDOM  Final   Special Requests NONE  Final   Colony Count   Final    50,000 COLONIES/ML Performed at [facility]    Culture   Final    Multiple bacterial morphotypes present, none predominant. Suggest appropriate recollection if clinically indicated. Performed at [facility]    Report Status 04/26/2015 FINAL  Final  Culture, sputum-assessment     Status: None   Collection Time: 04/25/15  4:55 PM  Result Value Ref Range Status   Specimen Description SPUTUM  Final   Special Requests NONE  Final   Sputum evaluation   Final    THIS SPECIMEN IS ACCEPTABLE. RESPIRATORY CULTURE REPORT TO FOLLOW.   Report Status 04/25/2015 FINAL  Final   Culture, respiratory (NON-Expectorated)     Status: None   Collection Time: 04/25/15  4:55 PM  Result Value Ref Range Status   Specimen Description  SPUTUM  Final   Special Requests NONE  Final   Gram Stain   Final    ABUNDANT WBC PRESENT,BOTH PMN AND MONONUCLEAR FEW SQUAMOUS EPITHELIAL CELLS PRESENT MODERATE GRAM POSITIVE COCCI IN PAIRS AND CHAINS Performed at [facility]    Culture   Final    NORMAL OROPHARYNGEAL FLORA Performed at [facility]    Report Status 04/28/2015 FINAL  Final  MRSA PCR Screening     Status: None   Collection Time: 04/26/15  2:59 AM  Result Value Ref Range Status   MRSA by PCR NEGATIVE NEGATIVE Final    Comment:        The GeneXpert MRSA Assay (FDA approved for NASAL specimens only), is one component of a comprehensive MRSA colonization surveillance program. It is not intended to diagnose MRSA infection nor to guide or monitor treatment for MRSA infections.   Culture, blood (routine x 2)     Status: None (Preliminary result)   Collection Time: 04/28/15  8:30 AM  Result Value Ref Range Status   Specimen Description BLOOD LEFT ARM  Final   Special Requests BOTTLES DRAWN AEROBIC AND ANAEROBIC 10CC  Final   Culture   Final           BLOOD CULTURE RECEIVED NO GROWTH TO DATE CULTURE WILL BE HELD FOR 5 DAYS BEFORE ISSUING A FINAL NEGATIVE REPORT Performed at [facility]    Report Status PENDING  Incomplete  Culture, blood (routine x 2)     Status: None (Preliminary result)   Collection Time: 04/28/15  8:38 AM  Result Value Ref Range Status   Specimen Description BLOOD RIGHT ARM  Final   Special Requests BOTTLES DRAWN AEROBIC AND ANAEROBIC 10CC  Final   Culture   Final           BLOOD CULTURE RECEIVED NO GROWTH TO DATE CULTURE WILL BE HELD FOR 5 DAYS BEFORE ISSUING A FINAL NEGATIVE REPORT Performed at [facility]    Report Status PENDING  Incomplete     Labs: Basic Metabolic Panel:  Recent Labs Lab  04/25/15 1454 04/26/15 0701 04/27/15 0355  NA 139 142 141  K 4.0 3.3* 3.2*  CL 106 110 108  CO2 $Re'24 25 26  'Egb$ GLUCOSE 94 82 91  BUN $Re'12 8 11  'vbD$ CREATININE 0.88 0.68 0.69  CALCIUM 8.3* 7.9* 8.4*  MG  --   --  1.8   Liver Function Tests:  Recent Labs Lab 04/25/15 1454  AST 26  ALT 25  ALKPHOS 112  BILITOT 0.7  PROT 6.4*  ALBUMIN 2.8*   No results for input(s): LIPASE, AMYLASE in the last 168 hours. No results for input(s): AMMONIA in the last 168 hours. CBC:  Recent Labs Lab 04/25/15 1454 04/26/15 0701 04/27/15 0355  WBC 20.9* 18.4* 11.4*  NEUTROABS 19.7*  --   --   HGB 14.7 12.0* 11.6*  HCT 42.1 35.7* 34.3*  MCV 85.1 84.6 84.9  PLT 253 203 234   Cardiac Enzymes: No results for input(s): CKTOTAL, CKMB, CKMBINDEX, TROPONINI in the last 168 hours. BNP: BNP (last 3 results) No results for input(s): BNP in the last 8760 hours.  ProBNP (last 3 results) No results for input(s): PROBNP in the last 8760 hours.  CBG: No results for input(s): GLUCAP in the last 168 hours.

## 2015-04-29 NOTE — Progress Notes (Addendum)
Pt VSS. New IV placed in left, anterior forearm for home IV abx.  Home health setup. Prescriptions given to pt, [facility]. Reviewed d/c information at bedside, no further questions. Justin Mendaudle, Shatarra Wehling H, RN

## 2015-04-29 NOTE — Discharge Instructions (Signed)
Pneumonia °Pneumonia is an infection of the lungs.  °CAUSES °Pneumonia may be caused by bacteria or a virus. Usually, these infections are caused by breathing infectious particles into the lungs (respiratory tract). °SIGNS AND SYMPTOMS  °· Cough. °· Fever. °· Chest pain. °· Increased rate of breathing. °· Wheezing. °· Mucus production. °DIAGNOSIS  °If you have the common symptoms of pneumonia, your health care provider will typically confirm the diagnosis with a chest X-ray. The X-ray will show an abnormality in the lung (pulmonary infiltrate) if you have pneumonia. Other tests of your blood, urine, or sputum may be done to find the specific cause of your pneumonia. Your health care provider may also do tests (blood gases or pulse oximetry) to see how well your lungs are working. °TREATMENT  °Some forms of pneumonia may be spread to other people when you cough or sneeze. You may be asked to wear a mask before and during your exam. Pneumonia that is caused by bacteria is treated with antibiotic medicine. Pneumonia that is caused by the influenza virus may be treated with an antiviral medicine. Most other viral infections must run their course. These infections will not respond to antibiotics.  °HOME CARE INSTRUCTIONS  °· Cough suppressants may be used if you are losing too much rest. However, coughing protects you by clearing your lungs. You should avoid using cough suppressants if you can. °· Your health care provider may have prescribed medicine if he or she thinks your pneumonia is caused by bacteria or influenza. Finish your medicine even if you start to feel better. °· Your health care provider may also prescribe an expectorant. This loosens the mucus to be coughed up. °· Take medicines only as directed by your health care provider. °· Do not smoke. Smoking is a common cause of bronchitis and can contribute to pneumonia. If you are a smoker and continue to smoke, your cough may last several weeks after your  pneumonia has cleared. °· A cold steam vaporizer or humidifier in your room or home may help loosen mucus. °· Coughing is often worse at night. Sleeping in a semi-upright position in a recliner or using a couple pillows under your head will help with this. °· Get rest as you feel it is needed. Your body will usually let you know when you need to rest. °PREVENTION °A pneumococcal shot (vaccine) is available to prevent a common bacterial cause of pneumonia. This is usually suggested for: °· People over 65 years old. °· Patients on chemotherapy. °· People with chronic lung problems, such as bronchitis or emphysema. °· People with immune system problems. °If you are over 65 or have a high risk condition, you may receive the pneumococcal vaccine if you have not received it before. In some countries, a routine influenza vaccine is also recommended. This vaccine can help prevent some cases of pneumonia. You may be offered the influenza vaccine as part of your care. °If you smoke, it is time to quit. You may receive instructions on how to stop smoking. Your health care provider can provide medicines and counseling to help you quit. °SEEK MEDICAL CARE IF: °You have a fever. °SEEK IMMEDIATE MEDICAL CARE IF:  °· Your illness becomes worse. This is especially true if you are elderly or weakened from any other disease. °· You cannot control your cough with suppressants and are losing sleep. °· You begin coughing up blood. °· You develop pain which is getting worse or is uncontrolled with medicines. °· Any of the symptoms   which initially brought you in for treatment are getting worse rather than better. °· You develop shortness of breath or chest pain. °MAKE SURE YOU:  °· Understand these instructions. °· Will watch your condition. °· Will get help right away if you are not doing well or get worse. °Document Released: 12/05/2005 Document Revised: 04/21/2014 Document Reviewed: 02/24/2011 °ExitCare® Patient Information ©2015  [employer]. This information is not intended to replace advice given to you by your health care provider. Make sure you discuss any questions you have with your health care provider. ° °Pneumonia °Pneumonia is an infection of the lungs.  °CAUSES °Pneumonia may be caused by bacteria or a virus. Usually, these infections are caused by breathing infectious particles into the lungs (respiratory tract). °SIGNS AND SYMPTOMS  °· Cough. °· Fever. °· Chest pain. °· Increased rate of breathing. °· Wheezing. °· Mucus production. °DIAGNOSIS  °If you have the common symptoms of pneumonia, your health care provider will typically confirm the diagnosis with a chest X-ray. The X-ray will show an abnormality in the lung (pulmonary infiltrate) if you have pneumonia. Other tests of your blood, urine, or sputum may be done to find the specific cause of your pneumonia. Your health care provider may also do tests (blood gases or pulse oximetry) to see how well your lungs are working. °TREATMENT  °Some forms of pneumonia may be spread to other people when you cough or sneeze. You may be asked to wear a mask before and during your exam. Pneumonia that is caused by bacteria is treated with antibiotic medicine. Pneumonia that is caused by the influenza virus may be treated with an antiviral medicine. Most other viral infections must run their course. These infections will not respond to antibiotics.  °HOME CARE INSTRUCTIONS  °· Cough suppressants may be used if you are losing too much rest. However, coughing protects you by clearing your lungs. You should avoid using cough suppressants if you can. °· Your health care provider may have prescribed medicine if he or she thinks your pneumonia is caused by bacteria or influenza. Finish your medicine even if you start to feel better. °· Your health care provider may also prescribe an expectorant. This loosens the mucus to be coughed up. °· Take medicines only as directed by your health care  provider. °· Do not smoke. Smoking is a common cause of bronchitis and can contribute to pneumonia. If you are a smoker and continue to smoke, your cough may last several weeks after your pneumonia has cleared. °· A cold steam vaporizer or humidifier in your room or home may help loosen mucus. °· Coughing is often worse at night. Sleeping in a semi-upright position in a recliner or using a couple pillows under your head will help with this. °· Get rest as you feel it is needed. Your body will usually let you know when you need to rest. °PREVENTION °A pneumococcal shot (vaccine) is available to prevent a common bacterial cause of pneumonia. This is usually suggested for: °· People over 65 years old. °· Patients on chemotherapy. °· People with chronic lung problems, such as bronchitis or emphysema. °· People with immune system problems. °If you are over 65 or have a high risk condition, you may receive the pneumococcal vaccine if you have not received it before. In some countries, a routine influenza vaccine is also recommended. This vaccine can help prevent some cases of pneumonia. You may be offered the influenza vaccine as part of your care. °If you smoke,   it is time to quit. You may receive instructions on how to stop smoking. Your health care provider can provide medicines and counseling to help you quit. °SEEK MEDICAL CARE IF: °You have a fever. °SEEK IMMEDIATE MEDICAL CARE IF:  °· Your illness becomes worse. This is especially true if you are elderly or weakened from any other disease. °· You cannot control your cough with suppressants and are losing sleep. °· You begin coughing up blood. °· You develop pain which is getting worse or is uncontrolled with medicines. °· Any of the symptoms which initially brought you in for treatment are getting worse rather than better. °· You develop shortness of breath or chest pain. °MAKE SURE YOU:  °· Understand these instructions. °· Will watch your condition. °· Will get  help right away if you are not doing well or get worse. °Document Released: 12/05/2005 Document Revised: 04/21/2014 Document Reviewed: 02/24/2011 °ExitCare® Patient Information ©2015 [employer]. This information is not intended to replace advice given to you by your health care provider. Make sure you discuss any questions you have with your health care provider. ° °

## 2015-04-29 NOTE — Care Management Note (Addendum)
Case Management Note  Patient Details  Name: Jon Hill MRN: [id number] Date of Birth: 29-Sep-1991  Subjective/Objective:    24 yo male admitted with sepsis secondary to pna                Action/Plan: 04/29/15 Patient [facility] He will have a new peripheral IV placed and today's dose of IV antibiotics and then will dc [facility]. Patient had no other questions or concerns.  Patient confirmed that he does not have a PCP or insurance. Provided patient with [facility] and his appointment information. He stated that his mother is a patient at the Clinic so he is aware of their services. Sp[facility] with [facility], regarding patient eligibility for charity services for possibility [facility]x [facility]. [facility]. Expected Discharge Date:   (unknown)               Expected Discharge Plan:  Home w Home Health Services  In-House Referral:  Financial Counselor  Discharge planning Services  CM Consult  Post Acute Care Choice:  Home Health Choice offered to:  Patient  DME Arranged:    DME Agency:     HH Arranged:  RN, IV Antibiotics HH Agency:  [facility]  Status of Service:  Completed, signed off  Medicare Important Message Given:  No Date Medicare IM Given:    Medicare IM give by:    Date Additional Medicare IM Given:    Additional Medicare Important Message give by:     If discussed at Long Length of Stay Meetings, dates discussed:    Additional Comments:  Gerrit HallsMuza, Taevin Mcferran S, RN 04/29/2015, 9:50 AM

## 2015-05-04 ENCOUNTER — Encounter: Payer: Self-pay | Admitting: Family Medicine

## 2015-05-04 ENCOUNTER — Ambulatory Visit: Payer: Self-pay | Attending: Family Medicine | Admitting: Family Medicine

## 2015-05-04 VITALS — BP 127/76 | HR 99 | Temp 98.0°F | Resp 18 | Ht 62.0 in | Wt 175.0 lb

## 2015-05-04 DIAGNOSIS — J189 Pneumonia, unspecified organism: Secondary | ICD-10-CM | POA: Insufficient documentation

## 2015-05-04 DIAGNOSIS — F17210 Nicotine dependence, cigarettes, uncomplicated: Secondary | ICD-10-CM | POA: Insufficient documentation

## 2015-05-04 DIAGNOSIS — F17200 Nicotine dependence, unspecified, uncomplicated: Secondary | ICD-10-CM

## 2015-05-04 DIAGNOSIS — R7881 Bacteremia: Secondary | ICD-10-CM

## 2015-05-04 DIAGNOSIS — E876 Hypokalemia: Secondary | ICD-10-CM | POA: Insufficient documentation

## 2015-05-04 DIAGNOSIS — Z862 Personal history of diseases of the blood and blood-forming organs and certain disorders involving the immune mechanism: Secondary | ICD-10-CM | POA: Insufficient documentation

## 2015-05-04 LAB — CULTURE, BLOOD (ROUTINE X 2)
CULTURE: NO GROWTH
CULTURE: NO GROWTH

## 2015-05-04 NOTE — Progress Notes (Signed)
Subjective:    Patient ID: Jon Hill, male    DOB: 05-Nov-1991, 24 y.o.   MRN: [id number]  HPI  Admit date:04/25/2015 Discharge date: 04/29/15  [facility] with worsening shortness of breath, cough productive of sputum, congestion. He had previously presented to the ED with the above symptoms where he received Zithromax and was discharged.  On presentation he was found to be tachycardic with a low normal blood pressure and was hypoxic. His labs revealed leukocytosis with a WBC of 20.9 and a lactic acidosis of 4.4. He had a chest x-ray which revealed left lower lobe consolidation consistent with pneumonia and was admitted for sepsis secondary to pneumonia.  He was admitted to stepdown unit and commenced on IV fluids, IV ceftriaxone and azithromycin. His blood cultures came back positive for Streptococcus pneumonia. He did have a hypokalemia and potassium was repleted; his white blood count decreased to 11.4 and lactic acid trended down to 1.4 and he improved symptom wise.;  infectious disease was consulted with a recommendation for IV ceftriaxone and by mouth Levaquin for 7 days on discharge.   Interval history: He reports doing well, denies any shortness of breath or cough. He has taken 6 of the 7 day course of his antibiotic but to cut his IV line yesterday because the site was pruritic.  Past Medical History  Diagnosis Date  . Pneumonia 04/2015    Past Surgical History  Procedure Laterality Date  . None      History reviewed. No pertinent family history.  History   Social History  . Marital Status: Single    Spouse Name: N/A  . Number of Children: N/A  . Years of Education: N/A   Occupational History  . Not on file.   Social History Main Topics  . Smoking status: Current Every Day Smoker -- 0.50 packs/day  . Smokeless tobacco: Never Used     Comment: 2-3 cigarettes a day  . Alcohol Use: No     Comment: stopped drinking in 2014  . Drug Use: No  .  Sexual Activity: Yes    Birth Control/ Protection: Condom   Other Topics Concern  . Not on file   Social History Narrative    No Known Allergies  Current Outpatient Prescriptions on File Prior to Visit  Medication Sig Dispense Refill  . albuterol (PROVENTIL HFA;VENTOLIN HFA) 108 (90 BASE) MCG/ACT inhaler Inhale 2 puffs into the lungs every 4 (four) hours as needed for wheezing or shortness of breath. 1 Inhaler 0  . cefTRIAXone 1 g in dextrose 5 % 50 mL Inject 1 g into the vein daily. 7 Syringe 0  . guaiFENesin 200 MG tablet Take 1 tablet (200 mg total) by mouth every 4 (four) hours as needed for cough or to loosen phlegm. 30 suppository 0  . levofloxacin (LEVAQUIN) 750 MG tablet Take 1 tablet (750 mg total) by mouth daily. 7 tablet 0  . naproxen (NAPROSYN) 500 MG tablet Take 1 tablet (500 mg total) by mouth 2 (two) times daily. (Patient not taking: Reported on 05/04/2015) 30 tablet 0   No current facility-administered medications on file prior to visit.      Review of Systems  Constitutional: Negative for activity change and appetite change.  HENT: Negative for sinus pressure and sore throat.   Eyes: Negative for visual disturbance.  Respiratory: Negative for chest tightness and shortness of breath.   Cardiovascular: Negative for chest pain and palpitations.  Gastrointestinal: Negative for  abdominal pain and abdominal distention.  Endocrine: Negative for cold intolerance, heat intolerance and polyphagia.  Genitourinary: Negative for dysuria, frequency and difficulty urinating.  Musculoskeletal: Negative for back pain, joint swelling and arthralgias.  Skin: Negative for color change.  Neurological: Negative for dizziness, tremors and weakness.  Psychiatric/Behavioral: Negative for suicidal ideas and behavioral problems.          Objective: Filed Vitals:   05/04/15 1557  BP: 127/76  Pulse: 99  Temp: 98 F (36.7 C)  TempSrc: Oral  Resp: 18  Height: 5\' 2"  (1.575 m)    Weight: 175 lb (79.379 kg)  SpO2: 95%      Physical Exam  Constitutional: He is oriented to person, place, and time. He appears well-developed and well-nourished.  HENT:  Head: Normocephalic and atraumatic.  Right Ear: External ear normal.  Left Ear: External ear normal.  Eyes: Conjunctivae and EOM are normal. Pupils are equal, round, and reactive to light.  Neck: Normal range of motion. Neck supple. No tracheal deviation present.  Cardiovascular: Normal rate, regular rhythm and normal heart sounds.   No murmur heard. Pulmonary/Chest: Effort normal. No respiratory distress. He has no wheezes. He exhibits no tenderness.  Reduced air entry in the left lower lobe; right is normal  Abdominal: Soft. Bowel sounds are normal. He exhibits no mass. There is no tenderness.  Musculoskeletal: Normal range of motion. He exhibits no edema or tenderness.  Neurological: He is alert and oriented to person, place, and time.  Skin: Skin is warm and dry.  Psychiatric: He has a normal mood and affect.     CLINICAL DATA: Shortness of breath, cough, and weakness for 2 weeks, was diagnosed with bronchitis ; today presents with tachycardia, systolic BP of 90's, smoker  EXAM: PORTABLE CHEST - 1 VIEW  COMPARISON: Portable exam 1443 hr compared to 04/22/2015  FINDINGS: Normal heart size, mediastinal contours and pulmonary vascularity.  LEFT lower lobe consolidation consistent with pneumonia new since prior exam.  Linear subsegmental atelectasis lower LEFT chest at lingula.  Remaining lungs clear.  No pleural effusion or pneumothorax.  Osseous structures unremarkable.  IMPRESSION: LEFT lower lobe consolidation consistent with pneumonia.  Subsegmental atelectasis lingula.   Electronically Signed  By: Ulyses SouthwardMark Boles M.D.  On: 04/25/2015 14:56        Assessment & Plan:  24 year old male patient recently hospitalized for septicemia secondary to strep pneumonia bacteremia  and left lower lobe pneumonia currently completed  6 of a 7 day course of IV and oral antibiotics.  Gram-positive septicemia: Resolved.  Left lower lobe pneumonia: Asymptomatic but unresolved evidenced by reduced and treatment left lower lobe. Patient advised he would need to complete course of antibiotic and so he will get in touch with his home health nurse for insertion of IV for completion of the seventh day of his IV ceftriaxone and Levaquin by mouth. I will see him back at his next office visit and reassess for the need for chest x-ray.  Hypokalemia: Repeat at next office visit  Tobacco use disorder: Smoking cessation support: smoking cessation hotline: 1-800-QUIT-NOW.  Smoking cessation classes are available through [facility]. Call [phone] or visit our website at [email or web address].  Spent 3 minutes counseling on smoking cessation and patient is ready to quit. Is refusing patches because he wants to try on his own.

## 2015-05-04 NOTE — Patient Instructions (Signed)
Smoking Cessation Quitting smoking is important to your health and has many advantages. However, it is not always easy to quit since nicotine is a very addictive drug. Oftentimes, people try 3 times or more before being able to quit. This document explains the best ways for you to prepare to quit smoking. Quitting takes hard work and a lot of effort, but you can do it. ADVANTAGES OF QUITTING SMOKING  You will live longer, feel better, and live better.  Your body will feel the impact of quitting smoking almost immediately.  Within 20 minutes, blood pressure decreases. Your pulse returns to its normal level.  After 8 hours, carbon monoxide levels in the blood return to normal. Your oxygen level increases.  After 24 hours, the chance of having a heart attack starts to decrease. Your breath, hair, and body stop smelling like smoke.  After 48 hours, damaged nerve endings begin to recover. Your sense of taste and smell improve.  After 72 hours, the body is virtually free of nicotine. Your bronchial tubes relax and breathing becomes easier.  After 2 to 12 weeks, lungs can hold more air. Exercise becomes easier and circulation improves.  The risk of having a heart attack, stroke, cancer, or lung disease is greatly reduced.  After 1 year, the risk of coronary heart disease is cut in half.  After 5 years, the risk of stroke falls to the same as a nonsmoker.  After 10 years, the risk of lung cancer is cut in half and the risk of other cancers decreases significantly.  After 15 years, the risk of coronary heart disease drops, usually to the level of a nonsmoker.  If you are pregnant, quitting smoking will improve your chances of having a healthy baby.  The people you live with, especially any children, will be healthier.  You will have extra money to spend on things other than cigarettes. QUESTIONS TO THINK ABOUT BEFORE ATTEMPTING TO QUIT You may want to talk about your answers with your  health care provider.  Why do you want to quit?  If you tried to quit in the past, what helped and what did not?  What will be the most difficult situations for you after you quit? How will you plan to handle them?  Who can help you through the tough times? Your family? Friends? A health care provider?  What pleasures do you get from smoking? What ways can you still get pleasure if you quit? Here are some questions to ask your health care provider:  How can you help me to be successful at quitting?  What medicine do you think would be best for me and how should I take it?  What should I do if I need more help?  What is smoking withdrawal like? How can I get information on withdrawal? GET READY  Set a quit date.  Change your environment by getting rid of all cigarettes, ashtrays, matches, and lighters in your home, car, or work. Do not let people smoke in your home.  Review your past attempts to quit. Think about what worked and what did not. GET SUPPORT AND ENCOURAGEMENT You have a better chance of being successful if you have help. You can get support in many ways.  Tell your family, friends, and coworkers that you are going to quit and need their support. Ask them not to smoke around you.  Get individual, group, or telephone counseling and support. Programs are available at local hospitals and health centers. Call   your local health department for information about programs in your area.  Spiritual beliefs and practices may help some smokers quit.  Download a "quit meter" on your computer to keep track of quit statistics, such as how long you have gone without smoking, cigarettes not smoked, and money saved.  Get a self-help book about quitting smoking and staying off tobacco. LEARN NEW SKILLS AND BEHAVIORS  Distract yourself from urges to smoke. Talk to someone, go for a walk, or occupy your time with a task.  Change your normal routine. Take a different route to work.  Drink tea instead of coffee. Eat breakfast in a different place.  Reduce your stress. Take a hot bath, exercise, or read a book.  Plan something enjoyable to do every day. Reward yourself for not smoking.  Explore interactive web-based programs that specialize in helping you quit. GET MEDICINE AND USE IT CORRECTLY Medicines can help you stop smoking and decrease the urge to smoke. Combining medicine with the above behavioral methods and support can greatly increase your chances of successfully quitting smoking.  Nicotine replacement therapy helps deliver nicotine to your body without the negative effects and risks of smoking. Nicotine replacement therapy includes nicotine gum, lozenges, inhalers, nasal sprays, and skin patches. Some may be available over-the-counter and others require a prescription.  Antidepressant medicine helps people abstain from smoking, but how this works is unknown. This medicine is available by prescription.  Nicotinic receptor partial agonist medicine simulates the effect of nicotine in your brain. This medicine is available by prescription. Ask your health care provider for advice about which medicines to use and how to use them based on your health history. Your health care provider will tell you what side effects to look out for if you choose to be on a medicine or therapy. Carefully read the information on the package. Do not use any other product containing nicotine while using a nicotine replacement product.  RELAPSE OR DIFFICULT SITUATIONS Most relapses occur within the first 3 months after quitting. Do not be discouraged if you start smoking again. Remember, most people try several times before finally quitting. You may have symptoms of withdrawal because your body is used to nicotine. You may crave cigarettes, be irritable, feel very hungry, cough often, get headaches, or have difficulty concentrating. The withdrawal symptoms are only temporary. They are strongest  when you first quit, but they will go away within 10-14 days. To reduce the chances of relapse, try to:  Avoid drinking alcohol. Drinking lowers your chances of successfully quitting.  Reduce the amount of caffeine you consume. Once you quit smoking, the amount of caffeine in your body increases and can give you symptoms, such as a rapid heartbeat, sweating, and anxiety.  Avoid smokers because they can make you want to smoke.  Do not let weight gain distract you. Many smokers will gain weight when they quit, usually less than 10 pounds. Eat a healthy diet and stay active. You can always lose the weight gained after you quit.  Find ways to improve your mood other than smoking. FOR MORE INFORMATION  www.smokefree.gov  Document Released: 11/29/2001 Document Revised: 04/21/2014 Document Reviewed: 03/15/2012 [employer] Patient Information 2015 [employer]. This information is not intended to replace advice given to you by your health care provider. Make sure you discuss any questions you have with your health care provider.  

## 2015-05-04 NOTE — Progress Notes (Signed)
Patient in hospital for pneumonia, discharged last week. Patient reports feeling better. Patient denies pain at this time. Patient had IV access until last night, patient pulled out IV last night because it was itching. Patient was supposed to get IV antibiotics today at 5pm. Patient ready to quit smoking.

## 2015-05-11 ENCOUNTER — Ambulatory Visit: Payer: Self-pay | Admitting: Family Medicine

## 2015-06-29 DIAGNOSIS — J189 Pneumonia, unspecified organism: Secondary | ICD-10-CM | POA: Insufficient documentation

## 2017-05-04 IMAGING — DX DG CHEST 1V PORT
1 series · 1 of 1 positions shown · non-contrast
Comparison: Portable exam 1883 hr compared to 04/22/2015

CLINICAL DATA: Shortness of breath, cough, and weakness for 2
weeks, was diagnosed with bronchitis ; today presents with
tachycardia, systolic BP of 90's, smoker

EXAM:
PORTABLE CHEST - 1 VIEW

[chest ap]
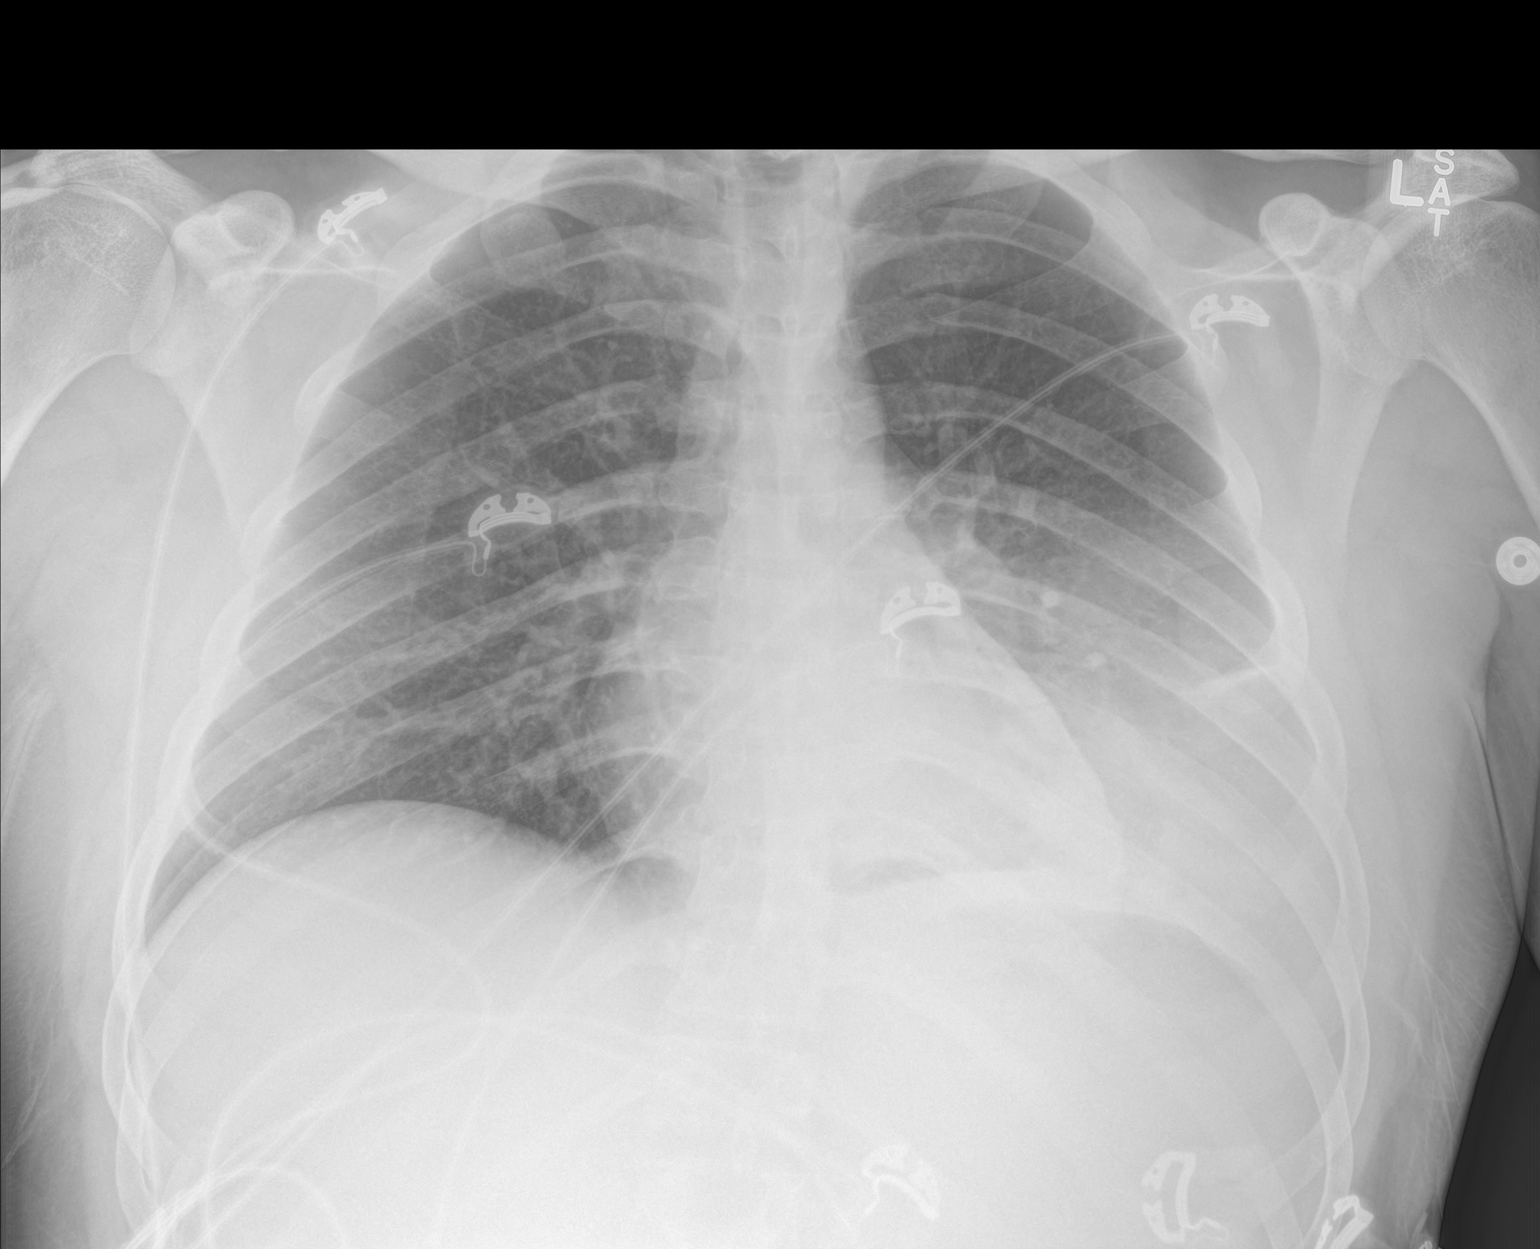

[1 of 1 positions shown; findings below may reference images not displayed]

FINDINGS: Normal heart size, mediastinal contours and pulmonary vascularity.

LEFT lower lobe consolidation consistent with pneumonia new since
prior exam.

Linear subsegmental atelectasis lower LEFT chest at lingula.

Remaining lungs clear.

No pleural effusion or pneumothorax.

Osseous structures unremarkable.
IMPRESSION: LEFT lower lobe consolidation consistent with pneumonia.

Subsegmental atelectasis lingula.
